# Patient Record
Sex: Female | Born: 1940 | Race: White | Hispanic: No | Smoking: Never smoker
Health system: Southern US, Community
[De-identification: ages and names within clinical notes are randomized; demographics above are authoritative.]

## PROBLEM LIST (undated history)

## (undated) DIAGNOSIS — C9110 Chronic lymphocytic leukemia of B-cell type not having achieved remission: Secondary | ICD-10-CM

## (undated) HISTORY — PX: APPENDECTOMY: SHX54

## (undated) HISTORY — PX: CHOLECYSTECTOMY: SHX55

## (undated) HISTORY — PX: ABDOMINAL HYSTERECTOMY: SHX81

## (undated) HISTORY — PX: JOINT REPLACEMENT: SHX530

---

## 2007-05-20 ENCOUNTER — Encounter (INDEPENDENT_AMBULATORY_CARE_PROVIDER_SITE_OTHER): Payer: Self-pay | Admitting: Orthopaedic Surgery

## 2007-05-20 ENCOUNTER — Ambulatory Visit (HOSPITAL_COMMUNITY): Admission: RE | Admit: 2007-05-20 | Discharge: 2007-05-20 | Payer: Self-pay | Admitting: Orthopaedic Surgery

## 2007-07-09 ENCOUNTER — Inpatient Hospital Stay (HOSPITAL_COMMUNITY): Admission: RE | Admit: 2007-07-09 | Discharge: 2007-07-15 | Payer: Self-pay | Admitting: Orthopaedic Surgery

## 2010-12-27 NOTE — Discharge Summary (Signed)
NAMEKIMERLY, Marie [name]                ACCOUNT NO.:  [id number]   MEDICAL RECORD NO.:  [id number]          PATIENT TYPE:  INP   LOCATION:  5039                         FACILITY:  [facility]   PHYSICIAN:  Lindwood Qua, P.A. DATE OF BIRTH:  02-Mar-1941   DATE OF ADMISSION:  07/09/2007  DATE OF DISCHARGE:  07/15/2007                               DISCHARGE SUMMARY   ADMISSION DIAGNOSES:  1. Left knee end-stage degenerative joint disease.  2. Chronic lymphocytic leukemia.  3. Hormone replacement therapy   DISCHARGE DIAGNOSES:  1. Left knee end-stage degenerative joint disease.  2. Chronic lymphocytic leukemia.  3. Hormone replacement therapy   PROCEDURE:  Left total knee revision replacement.   BRIEF HISTORY:  Ms. Mclaurin is a  70 year old white female patient  complaining of left knee pain after a union condylar knee replacement in  2004.  X-rays reveals his components appear to be loose.  She is having  pain and swelling when she walks  and trouble sleeping at night time.   PERTINENT LABORATORY DATA AND X-RAY FINDINGS:  Sodium 137, potassium  4.0, chloride 102, glucose 72, BUN 16, creatinine 0.81.  Urinalysis  normal.  Hemoglobin 12.9, hematocrit 38.7,  platelet 225.  WBC's at  60,000.   HOSPITAL COURSE:  She was admitted postoperatively.  She was placed on  variety of oral and IM analgesics for pain and  IV antibiotics times 24  hours  was used.  CPM machine 0 to 50 and advance as tolerated.  She had  a PCA pump also to be used, muscle relaxers,  oral anti-pain  medications.  Coumadin and Lovenox for DVT prophylaxis.  Therapy  regulated by pharmacy, knee-high TED,  incentive spirometer also used,  Foley catheter used for the first 1-2 days and then discontinued.  Her  first day postoperative vital signs were stable.  She had normal  neurovascular status to her left lower extremity.  Drain was in place.  IV's and PCA pump being used.  They were later discontinued the second  day.   Catheter discontinued the second day.  She was in a situation with  home care.  She had no significant help at home so nursing home  placement needed to be found.  Once that was done, she was able to be  discharged on dressing changes.  Her  wounds were noted to be benign.  There were no signs of infection.  Lungs were clear.  Abdomen was soft.  She did have a slight temperature elevation and was told to use her  incentive spirometer which did seem to make a difference.  FL-2 form was  signed.  Dressings were changed prior leaving the hospital. The  wound  was noted to be normal.  She did have some temperature elevation.  The  PA for Dr. Renae Fickle, who was on call,  ordered an albuterol treatment which  did help her pulmonary status, but did cause headache and this was  discontinued.   CONDITION ON DISCHARGE:  Improved.   FOLLOW UP:  She should be on a low-sodium,  heart-healthy diet,  may be  weightbearing as tolerated.  Physical therapy should work with her leg  to gain as much motion as possible.  Change the dressing on her wound  daily.  She will have INR's drawn for two weeks.  She will be on  Coumadin for a total of two weeks.  Dose regulated by pharmacy.  Percocet one or two every 4-6 hours.  Tylenol one or two for temperature  elevation above 100.5, ferrous sulfate 325 mg one per day,  hormone-  replacement therapy 0.45 mg,  daily potassium chloride 10 mEq one a day,  vitamin B12 - 250 mcg one a day, Mega 3 acid one GM cap daily,  vitamin  C 500 mg a day and multiple vitamin one per day,  laxative enema as  needed, Percocet one or two every four to six as needed for pain,  Robaxin 500 mg one orally every six to eight as needed for  muscle  spasm.      Lindwood Qua, P.A.    ______________________________  Lindwood Qua, P.A.    MC/MEDQ  D:  07/15/2007  T:  07/15/2007  Job:  [id number]

## 2010-12-27 NOTE — Discharge Summary (Signed)
Marie Marie [name], Marie [name]                ACCOUNT NO.:  [id number]   MEDICAL RECORD NO.:  [id number]          PATIENT TYPE:  INP   LOCATION:  NA                           FACILITY:  [facility]   PHYSICIAN:  Marie Marie [name], M.D.    DATE OF BIRTH:  11-23-1940   DATE OF ADMISSION:  05/14/2007  DATE OF DISCHARGE:  05/20/2007                               DISCHARGE SUMMARY   DICTATED BY:  Marie Marie [name].   PRIMARY CARE PHYSICIAN:  Marie Marie [name] at the [facility].  Phone number there is [phone].   DISCHARGE DIAGNOSES:  1. Acute pancreatitis.  2. Hypertension.  3. Hypercholesterolemia.   DISCHARGE MEDICATIONS:  All new medications:  1. Vicodin 500/5, take 1 to 2 tablets q.4 hours.  2. Lipitor 40 mg daily.  3. Aspirin 81 mg daily.  4. Lisinopril/hydrochlorothiazide combination 10/12.5 mg daily.   CONSULTATIONS:  GI was consulted for possible choledocho stone causing  acute pancreatitis.  MR/CT was done.  MR/CT did not show any evidence of  any stone impaction.   PROCEDURES:  1. CT of abdomen with contrast was performed on May 14, 2007.      Impression:  Inflammatory stranding and retroperitoneal fluid      associated with edematous pancreatic head, consistent with acute      pancreatitis.  A small 4-mm calcific density in the region of the      ampulla may represent an obstructing choledocho stone.  No evidence      of organized fluid collection.  2. Ultrasound of abdomen performed on May 15, 2007.  Impression:      No evidence of gallstones or cholecystitis.  Heterogeneous      pancreas, likely related to pancreatitis.  3. MR/CT performed on May 17, 2007.  Impression:  No significant      dilatation of the ball ducts or the pancreatic duct.  There is a      tiny calcification seen on the CT and the head of the pancreas, and      it is not appreciable on this study.  There is evidence of      peripancreatic fluid consistent with pancreatitis.  No  discrete      pseudocyst or mass.  4. KUB of abdomen, rule out ileus, performed on May 18, 2007.      Impression:  There was mild increased and probable bibasilar      atelectasis, and a small left pleural effusion, stable      nonobstructive bowel gas pattern.  5. EKG, which showed atrial enlargement and nonspecific T-wave      changes.   ADMISSION LABORATORY DATA:  Sodium 142, potassium 4.2, chloride 105,  bicarb 27, BUN 9, creatinine 0.88, glucose 139.  Amylase was 1766,  lipase was 1979, alk phos was 55, T-bili was 0.6, calcium 9.1, total  protein 6.7, albumin was 4.1, AST 24, ALT 20.   DISCHARGE LABORATORY DATA:  Sodium 133, potassium 3.6, chloride 96,  bicarb 26, BUN 8, creatinine 0.78, glucose 93.  Amylase was 40, lipase  12, AST 17,  ALT 17, alk phos was 101.  T-bili was 1.2, which is a  decrease of a max of T-bili during hospital course of 1.6.  Albumin was  2.7.  TSH was within normal limits.   BRIEF HOSPITAL COURSE:  1. Acute pancreatitis:  Laboratory, radiologic and clinical findings      suggest acute pancreatitis.  The patient has a history of chronic      alcohol abuse, which she stated that she stopped 7 months ago with      rehabilitation.  Acute pancreatitis may actually be an acute on      chronic pancreatitis versus a stone impaction.  Radiologic evidence      show a choledocho stone, which may have passed during hospital      course.  Pain was controlled with Dilaudid PCA, and then switched      to Vicodin p.o. per the patient's tolerance.  Also, during the      course of the stay, the patient had an elevated temperature to      100.6.  Zosyn 4.5 mg was started empirically.  The patient received      a couple of doses, and it was later discontinued because there were      no other clinical findings suggesting SIRS.  The patient was      tolerated some solids as well as liquids without any problems at      the date of discharge.  We will continue to follow with  p.o. pain      medication, and will follow up with her PCP within the next 3 to 4      days.  2. Hypertension:  The patient has a history of hypertension during      hospital course.  Elevated blood pressures were mostly contributed      to the patient's pain control, as elevations with increased pain      during duration blood pressure were ranged from the 140s to 190s      for systolic, and 70s to upper 90s for diastolic.  Because the      patient was n.p.o., no antihypertensives were started at the      beginning of the hospital course.  The day prior to discharge, the      patient was able to tolerate some foods; therefore, lisinopril 10      mg daily and hydrochlorothiazide 12.5 mg daily were started to      control the patient's blood pressure, and we will continue with a      combination of the two in outpatient.  3. Hypercholesterolemia:  During the hospital course, a lipid panel      was performed.  Cholesterol came back at 270, HDL 37, LDL 220,      triglycerides of 63.  The patient was not started on any type of      statin during the hospital course due to n.p.o. status.  At the      date of discharge, the patient was started on Lipitor 40 mg daily,      which she will continue as an outpatient.  4. Abnormal EKG:  The patient had two abnormal EKGs, both with the      same impression of possible atrial enlargement and nonspecific T-      wave changes.  Cardiac enzymes were only slightly elevated.  There      were no clinical findings on exam, so the patient was asymptomatic  during the hospital course.  There was no action taken at this      time.  The plan was to start aspirin 81 mg, antihypertensive and      statin as soon as the patient was able to tolerate food.  On      discharge, the patient was started on aspirin 81 mg, as well as      lisinopril/hydrochlorothiazide, and Lipitor 40.  The patient will      continue to follow up in outpatient clinic.   DISCHARGE  INSTRUCTIONS:  The patient was advised to start a low-salt-  healthy diet, also with low-fat content.  The patient was also told to  follow up with her PCP, which would be Marie Marie [name], on October 10, at 1:30  p.m.  The phone number there at the [facility] is  [phone].      Marie Marie [name], M.D.  Electronically Signed     WAH/MEDQ  D:  05/20/2007  T:  05/20/2007  Job:  [id number]

## 2010-12-27 NOTE — Op Note (Signed)
Marie [name], Marie [name]                ACCOUNT NO.:  [id number]   MEDICAL RECORD NO.:  [id number]          PATIENT TYPE:  INP   LOCATION:  2550                         FACILITY:  [facility]   PHYSICIAN:  Lubertha Basque. Dalldorf, M.D.DATE OF BIRTH:  1940-12-19   DATE OF PROCEDURE:  07/09/2007  DATE OF DISCHARGE:                               OPERATIVE REPORT   PREOPERATIVE DIAGNOSIS:  Loose left unicondylar knee replacement.   POSTOPERATIVE DIAGNOSIS:  Loose left unicondylar knee replacement.   PROCEDURE:  Revision of left total knee replacement.   ANESTHESIA:  General and block.   ATTENDING SURGEON:  Lubertha Basque. Jerl Santos, M.D.   ASSISTANT:  Lindwood Qua, P.A.   INDICATIONS FOR PROCEDURE:  The patient is a 70 year old woman, about 4  years from a left unicondylar knee replacement done in [place].  This  has always hurt somewhat, but has been much worse recently and she has  developed a bit of a varus deformity.  She has pain which limits her  ability to walk and rest.  Her x-ray shows she has a loose unicondylar  replacement.  She is offered a revision procedure.  Informed operative  consent was obtained, after the discussion of the possible complications  of reaction to anesthesia, infection, DVT, PE, and death.   SUMMARY FINDINGS AND PROCEDURE:  Under general anesthesia and a block  through an extension of her old incision, a loose unicondylar knee  replacement was removed.  There was no sign of infection.  She had a bit  of synovitis, addressed with a thorough synovectomy.  We then performed  a revision procedure using DePuy components.  We used a size standard  femoral LCS component, with a 17.5 mm spacer.  We also placed a size 3  MBT with a 30 mm stem extension, cemented.  At the patella we used a  size 38 all-polyethylene component.  We did include antibiotic in the  cement.  Bryna Colander assisted throughout and was invaluable to the  completion of the case, and he helped position  and retract while I  performed the procedure.  He also closed simultaneously to help minimize  the OR time.   DESCRIPTION OF PROCEDURE:  The patient was taken to the operating suite,  where general anesthetic was applied without difficulty.  She was also  given a block in the preanesthesia area.  She was positioned supine;  prepped and draped in normal sterile fashion.  After the administration  of IV Kefzol, the left leg was elevated, exsanguinated, and tourniquet  inflated about the thigh.  We used her entire old incision with a  proximal extension.  Dissection was carried down to the extensor  mechanism, where a medial parapatellar incision was made.  All  appropriate anti-infective measures were used, including the  preoperative IV antibiotic, Betadine-impregnated drape, and closed  hooded exhaust systems for each member of the surgical team.  The  kneecap was flipped and the knee flexed.  The tibial component was  frankly loose, and was easily removed with some pickups.  The femoral  component was loose, but did require  an osteotome to lever off.  There  was a bit of bone defect medially.  We placed an intramedullary guide  and made a cut basically flush with the medial defect, and removed about  11 or 12 mm of lateral tibial bone.  We then placed an intramedullary  guide in the femur, made anterior and posterior cuts creating a flexion  gap of 17.5 mm.  A second intramedullary isthmus was placed in the  femur, and a distal cut was created -- correcting her varus deformity  and achieving a balance 17.5 mm extension gap.  The femur was sized to a  standard with the appropriate guides placed and utilized, while the  tibia sized to a 3 with appropriate guides placed and utilized for the  MBT revision tray.  We removed some cement and soft tissue from both  bones with a curet.  The patella was cut down in thickness by 10 mm to  15, nd sized to a 38 with the appropriate guides placed and  utilized.  We then used pulsatile lavage to clean all 3 cut bony surfaces; after a  trial reduction showed good alignment and good tracking in the patella.  Cement was mixed, including Zinacef 1.5 grams.  This was pressurized  onto all the bones, followed by placement of the aforementioned DePuy  components.  Pressure was held on the components until the cement had  hardened.  Excess cement was trimmed.  The knee again ranged fully with  some flat hyperextension to good flexion; the patella tracked well.  The  tourniquet was deflated and a small amount of bleeding was easily  controlled with Bovie cautery.  The knee was irrigated, followed by  placement of a drain after exiting superolaterally.  The extensor  mechanism was reapproximated with #1 Vicryl in interrupted fashion,  followed by subcutaneous reapproximation with 0 and 2-0 undyed Vicryl;  and skin closure with staples.  Adaptic was applied, followed by dry  gauze and a loose Ace wrap.  Estimated blood loss and intraoperative  fluids obtained from anesthesia records, as to the accurate tourniquet  time.   DISPOSITION:  The patient was extubated in the operating room and taken  to recovery in stable condition.  She was to be admitted to the  orthopedic surgery service for appropriate postoperative care; to  include perioperative antibiotics and Coumadin plus Lovenox for DVT  prophylaxis.      Lubertha Basque Jerl Santos, M.D.  Electronically Signed     PGD/MEDQ  D:  07/09/2007  T:  07/09/2007  Job:  [id number]

## 2011-05-22 LAB — PROTIME-INR: Prothrombin Time: 28.2 — ABNORMAL HIGH

## 2011-05-23 LAB — CBC
HCT: 24.6 — ABNORMAL LOW
HCT: 25 — ABNORMAL LOW
HCT: 38.7
Hemoglobin: 8.2 — ABNORMAL LOW
Hemoglobin: 8.6 — ABNORMAL LOW
Hemoglobin: 9.7 — ABNORMAL LOW
MCHC: 33.3
MCHC: 33.6
MCHC: 34.3
MCV: 97.2
MCV: 97.6
Platelets: 225
RBC: 2.53 — ABNORMAL LOW
RBC: 2.62 — ABNORMAL LOW
RBC: 2.98 — ABNORMAL LOW
RDW: 13.4
RDW: 13.5
WBC: 36.9 — ABNORMAL HIGH

## 2011-05-23 LAB — BASIC METABOLIC PANEL
BUN: 16
BUN: 6
CO2: 28
CO2: 31
Calcium: 8.3 — ABNORMAL LOW
Chloride: 102
Chloride: 98
Chloride: 98
Chloride: 98
GFR calc Af Amer: >60
GFR calc Af Amer: >60
Glucose, Bld: 116 — ABNORMAL HIGH
Glucose, Bld: 72
Potassium: 4
Potassium: 4
Potassium: 4.1
Sodium: 133 — ABNORMAL LOW
Sodium: 135
Sodium: 137

## 2011-05-23 LAB — URINALYSIS, ROUTINE W REFLEX MICROSCOPIC
Bilirubin Urine: NEGATIVE
Ketones, ur: NEGATIVE
Nitrite: NEGATIVE
Protein, ur: NEGATIVE
Urobilinogen, UA: 0.2

## 2011-05-23 LAB — PROTIME-INR
INR: 1.1
INR: 3.8 — ABNORMAL HIGH
Prothrombin Time: 38.6 — ABNORMAL HIGH

## 2011-05-25 LAB — DIFFERENTIAL
Basophils Absolute: 0
Eosinophils Relative: 0
Lymphocytes Relative: 86 — ABNORMAL HIGH
Lymphs Abs: 44.6 — ABNORMAL HIGH
Monocytes Absolute: 1.3 — ABNORMAL HIGH
Neutro Abs: 5.6

## 2011-05-25 LAB — CBC
HCT: 38
Hemoglobin: 12.7
Platelets: 219
WBC: 51.3

## 2016-01-25 DIAGNOSIS — C9111 Chronic lymphocytic leukemia of B-cell type in remission: Secondary | ICD-10-CM | POA: Diagnosis not present

## 2016-05-19 ENCOUNTER — Emergency Department (HOSPITAL_COMMUNITY): Payer: Medicare Other

## 2016-05-19 ENCOUNTER — Inpatient Hospital Stay (HOSPITAL_COMMUNITY): Payer: Medicare Other

## 2016-05-19 ENCOUNTER — Inpatient Hospital Stay (HOSPITAL_COMMUNITY)
Admission: EM | Admit: 2016-05-19 | Discharge: 2016-06-14 | DRG: 463 | Disposition: E | Payer: Medicare Other | Attending: General Surgery | Admitting: General Surgery

## 2016-05-19 ENCOUNTER — Encounter (HOSPITAL_COMMUNITY): Payer: Self-pay

## 2016-05-19 DIAGNOSIS — Z23 Encounter for immunization: Secondary | ICD-10-CM

## 2016-05-19 DIAGNOSIS — M79676 Pain in unspecified toe(s): Secondary | ICD-10-CM

## 2016-05-19 DIAGNOSIS — N390 Urinary tract infection, site not specified: Secondary | ICD-10-CM | POA: Diagnosis not present

## 2016-05-19 DIAGNOSIS — J189 Pneumonia, unspecified organism: Secondary | ICD-10-CM | POA: Diagnosis not present

## 2016-05-19 DIAGNOSIS — E860 Dehydration: Secondary | ICD-10-CM | POA: Diagnosis present

## 2016-05-19 DIAGNOSIS — S52514A Nondisplaced fracture of right radial styloid process, initial encounter for closed fracture: Secondary | ICD-10-CM | POA: Diagnosis present

## 2016-05-19 DIAGNOSIS — I4891 Unspecified atrial fibrillation: Secondary | ICD-10-CM | POA: Diagnosis not present

## 2016-05-19 DIAGNOSIS — S52021A Displaced fracture of olecranon process without intraarticular extension of right ulna, initial encounter for closed fracture: Secondary | ICD-10-CM | POA: Diagnosis present

## 2016-05-19 DIAGNOSIS — I2699 Other pulmonary embolism without acute cor pulmonale: Secondary | ICD-10-CM | POA: Diagnosis not present

## 2016-05-19 DIAGNOSIS — N63 Unspecified lump in unspecified breast: Secondary | ICD-10-CM | POA: Diagnosis present

## 2016-05-19 DIAGNOSIS — S32402A Unspecified fracture of left acetabulum, initial encounter for closed fracture: Principal | ICD-10-CM | POA: Diagnosis present

## 2016-05-19 DIAGNOSIS — R262 Difficulty in walking, not elsewhere classified: Secondary | ICD-10-CM

## 2016-05-19 DIAGNOSIS — E876 Hypokalemia: Secondary | ICD-10-CM | POA: Diagnosis present

## 2016-05-19 DIAGNOSIS — R000 Tachycardia, unspecified: Secondary | ICD-10-CM

## 2016-05-19 DIAGNOSIS — M542 Cervicalgia: Secondary | ICD-10-CM | POA: Diagnosis not present

## 2016-05-19 DIAGNOSIS — S2239XA Fracture of one rib, unspecified side, initial encounter for closed fracture: Secondary | ICD-10-CM

## 2016-05-19 DIAGNOSIS — S3282XA Multiple fractures of pelvis without disruption of pelvic ring, initial encounter for closed fracture: Secondary | ICD-10-CM | POA: Diagnosis present

## 2016-05-19 DIAGNOSIS — A6000 Herpesviral infection of urogenital system, unspecified: Secondary | ICD-10-CM | POA: Diagnosis not present

## 2016-05-19 DIAGNOSIS — Y92410 Unspecified street and highway as the place of occurrence of the external cause: Secondary | ICD-10-CM

## 2016-05-19 DIAGNOSIS — T148XXA Other injury of unspecified body region, initial encounter: Secondary | ICD-10-CM

## 2016-05-19 DIAGNOSIS — M545 Low back pain, unspecified: Secondary | ICD-10-CM

## 2016-05-19 DIAGNOSIS — K598 Other specified functional intestinal disorders: Secondary | ICD-10-CM

## 2016-05-19 DIAGNOSIS — D62 Acute posthemorrhagic anemia: Secondary | ICD-10-CM | POA: Diagnosis not present

## 2016-05-19 DIAGNOSIS — S32039A Unspecified fracture of third lumbar vertebra, initial encounter for closed fracture: Secondary | ICD-10-CM | POA: Diagnosis present

## 2016-05-19 DIAGNOSIS — B9620 Unspecified Escherichia coli [E. coli] as the cause of diseases classified elsewhere: Secondary | ICD-10-CM | POA: Diagnosis present

## 2016-05-19 DIAGNOSIS — R531 Weakness: Secondary | ICD-10-CM

## 2016-05-19 DIAGNOSIS — I471 Supraventricular tachycardia: Secondary | ICD-10-CM | POA: Diagnosis present

## 2016-05-19 DIAGNOSIS — C9110 Chronic lymphocytic leukemia of B-cell type not having achieved remission: Secondary | ICD-10-CM | POA: Diagnosis present

## 2016-05-19 DIAGNOSIS — S62109A Fracture of unspecified carpal bone, unspecified wrist, initial encounter for closed fracture: Secondary | ICD-10-CM | POA: Diagnosis present

## 2016-05-19 DIAGNOSIS — R5383 Other fatigue: Secondary | ICD-10-CM | POA: Diagnosis not present

## 2016-05-19 DIAGNOSIS — K560 Paralytic ileus: Secondary | ICD-10-CM

## 2016-05-19 DIAGNOSIS — R7881 Bacteremia: Secondary | ICD-10-CM | POA: Diagnosis not present

## 2016-05-19 DIAGNOSIS — S22019A Unspecified fracture of first thoracic vertebra, initial encounter for closed fracture: Secondary | ICD-10-CM | POA: Diagnosis present

## 2016-05-19 DIAGNOSIS — B009 Herpesviral infection, unspecified: Secondary | ICD-10-CM | POA: Diagnosis present

## 2016-05-19 DIAGNOSIS — S32049A Unspecified fracture of fourth lumbar vertebra, initial encounter for closed fracture: Secondary | ICD-10-CM | POA: Diagnosis present

## 2016-05-19 DIAGNOSIS — S51812A Laceration without foreign body of left forearm, initial encounter: Secondary | ICD-10-CM | POA: Diagnosis present

## 2016-05-19 DIAGNOSIS — S299XXA Unspecified injury of thorax, initial encounter: Secondary | ICD-10-CM | POA: Diagnosis present

## 2016-05-19 DIAGNOSIS — N179 Acute kidney failure, unspecified: Secondary | ICD-10-CM | POA: Diagnosis present

## 2016-05-19 DIAGNOSIS — G9340 Encephalopathy, unspecified: Secondary | ICD-10-CM | POA: Diagnosis present

## 2016-05-19 DIAGNOSIS — M25552 Pain in left hip: Secondary | ICD-10-CM

## 2016-05-19 DIAGNOSIS — E871 Hypo-osmolality and hyponatremia: Secondary | ICD-10-CM | POA: Diagnosis not present

## 2016-05-19 DIAGNOSIS — S129XXA Fracture of neck, unspecified, initial encounter: Secondary | ICD-10-CM | POA: Diagnosis present

## 2016-05-19 DIAGNOSIS — R339 Retention of urine, unspecified: Secondary | ICD-10-CM | POA: Diagnosis not present

## 2016-05-19 DIAGNOSIS — R1312 Dysphagia, oropharyngeal phase: Secondary | ICD-10-CM

## 2016-05-19 DIAGNOSIS — S2243XA Multiple fractures of ribs, bilateral, initial encounter for closed fracture: Secondary | ICD-10-CM | POA: Diagnosis present

## 2016-05-19 DIAGNOSIS — S329XXA Fracture of unspecified parts of lumbosacral spine and pelvis, initial encounter for closed fracture: Secondary | ICD-10-CM

## 2016-05-19 DIAGNOSIS — I10 Essential (primary) hypertension: Secondary | ICD-10-CM | POA: Diagnosis present

## 2016-05-19 DIAGNOSIS — K567 Ileus, unspecified: Secondary | ICD-10-CM | POA: Diagnosis not present

## 2016-05-19 DIAGNOSIS — S12601A Unspecified nondisplaced fracture of seventh cervical vertebra, initial encounter for closed fracture: Secondary | ICD-10-CM | POA: Diagnosis present

## 2016-05-19 DIAGNOSIS — A400 Sepsis due to streptococcus, group A: Secondary | ICD-10-CM | POA: Diagnosis not present

## 2016-05-19 DIAGNOSIS — I425 Other restrictive cardiomyopathy: Secondary | ICD-10-CM | POA: Diagnosis not present

## 2016-05-19 DIAGNOSIS — R739 Hyperglycemia, unspecified: Secondary | ICD-10-CM | POA: Diagnosis present

## 2016-05-19 DIAGNOSIS — S41102A Unspecified open wound of left upper arm, initial encounter: Secondary | ICD-10-CM

## 2016-05-19 DIAGNOSIS — R1310 Dysphagia, unspecified: Secondary | ICD-10-CM | POA: Diagnosis not present

## 2016-05-19 DIAGNOSIS — Z7901 Long term (current) use of anticoagulants: Secondary | ICD-10-CM

## 2016-05-19 DIAGNOSIS — S32029A Unspecified fracture of second lumbar vertebra, initial encounter for closed fracture: Secondary | ICD-10-CM | POA: Diagnosis present

## 2016-05-19 DIAGNOSIS — K5981 Ogilvie syndrome: Secondary | ICD-10-CM

## 2016-05-19 DIAGNOSIS — Z96652 Presence of left artificial knee joint: Secondary | ICD-10-CM | POA: Diagnosis present

## 2016-05-19 DIAGNOSIS — IMO0002 Reserved for concepts with insufficient information to code with codable children: Secondary | ICD-10-CM

## 2016-05-19 DIAGNOSIS — Z4659 Encounter for fitting and adjustment of other gastrointestinal appliance and device: Secondary | ICD-10-CM

## 2016-05-19 DIAGNOSIS — S2249XA Multiple fractures of ribs, unspecified side, initial encounter for closed fracture: Secondary | ICD-10-CM

## 2016-05-19 DIAGNOSIS — E162 Hypoglycemia, unspecified: Secondary | ICD-10-CM | POA: Diagnosis not present

## 2016-05-19 DIAGNOSIS — S32009A Unspecified fracture of unspecified lumbar vertebra, initial encounter for closed fracture: Secondary | ICD-10-CM | POA: Diagnosis present

## 2016-05-19 DIAGNOSIS — B001 Herpesviral vesicular dermatitis: Secondary | ICD-10-CM | POA: Diagnosis not present

## 2016-05-19 DIAGNOSIS — Z86711 Personal history of pulmonary embolism: Secondary | ICD-10-CM | POA: Diagnosis not present

## 2016-05-19 HISTORY — DX: Chronic lymphocytic leukemia of B-cell type not having achieved remission: C91.10

## 2016-05-19 LAB — I-STAT CHEM 8, ED
BUN: 25 mg/dL — ABNORMAL HIGH (ref 6–20)
CHLORIDE: 101 mmol/L (ref 101–111)
CREATININE: 1.3 mg/dL — AB (ref 0.44–1.00)
Calcium, Ion: 1.17 mmol/L (ref 1.15–1.40)
GLUCOSE: 143 mg/dL — AB (ref 65–99)
HEMATOCRIT: 36 % (ref 36.0–46.0)
Hemoglobin: 12.2 g/dL (ref 12.0–15.0)
POTASSIUM: 4.2 mmol/L (ref 3.5–5.1)
Sodium: 140 mmol/L (ref 135–145)
TCO2: 28 mmol/L (ref 0–100)

## 2016-05-19 LAB — COMPREHENSIVE METABOLIC PANEL
ALT: 31 U/L (ref 14–54)
AST: 60 U/L — AB (ref 15–41)
Albumin: 3.9 g/dL (ref 3.5–5.0)
Alkaline Phosphatase: 53 U/L (ref 38–126)
Anion gap: 8 (ref 5–15)
BILIRUBIN TOTAL: 0.5 mg/dL (ref 0.3–1.2)
BUN: 21 mg/dL — AB (ref 6–20)
CO2: 27 mmol/L (ref 22–32)
CREATININE: 1.15 mg/dL — AB (ref 0.44–1.00)
Calcium: 9.3 mg/dL (ref 8.9–10.3)
Chloride: 103 mmol/L (ref 101–111)
GFR calc Af Amer: 53 mL/min — ABNORMAL LOW (ref >60)
GFR, EST NON AFRICAN AMERICAN: 46 mL/min — AB (ref >60)
Glucose, Bld: 144 mg/dL — ABNORMAL HIGH (ref 65–99)
Potassium: 4.9 mmol/L (ref 3.5–5.1)
Sodium: 138 mmol/L (ref 135–145)
TOTAL PROTEIN: 6.4 g/dL — AB (ref 6.5–8.1)

## 2016-05-19 LAB — CBC
HCT: 38 % (ref 36.0–46.0)
Hemoglobin: 11.4 g/dL — ABNORMAL LOW (ref 12.0–15.0)
MCH: 30.3 pg (ref 26.0–34.0)
MCHC: 30 g/dL (ref 30.0–36.0)
MCV: 101.1 fL — ABNORMAL HIGH (ref 78.0–100.0)
PLATELETS: 154 10*3/uL (ref 150–400)
RBC: 3.76 MIL/uL — ABNORMAL LOW (ref 3.87–5.11)
RDW: 14.7 % (ref 11.5–15.5)
WBC: 72.2 10*3/uL — AB (ref 4.0–10.5)

## 2016-05-19 LAB — URINALYSIS, ROUTINE W REFLEX MICROSCOPIC
BILIRUBIN URINE: NEGATIVE
Glucose, UA: NEGATIVE mg/dL
Ketones, ur: NEGATIVE mg/dL
NITRITE: NEGATIVE
PROTEIN: NEGATIVE mg/dL
Specific Gravity, Urine: 1.01 (ref 1.005–1.030)
pH: 6 (ref 5.0–8.0)

## 2016-05-19 LAB — URINE MICROSCOPIC-ADD ON

## 2016-05-19 LAB — ETHANOL

## 2016-05-19 LAB — CDS SEROLOGY

## 2016-05-19 LAB — PROTIME-INR
INR: 1.07
PROTHROMBIN TIME: 13.9 s (ref 11.4–15.2)

## 2016-05-19 LAB — SAMPLE TO BLOOD BANK

## 2016-05-19 LAB — I-STAT CG4 LACTIC ACID, ED: LACTIC ACID, VENOUS: 1.56 mmol/L (ref 0.5–1.9)

## 2016-05-19 MED ORDER — FENTANYL CITRATE (PF) 100 MCG/2ML IJ SOLN
50.0000 ug | Freq: Once | INTRAMUSCULAR | Status: AC
  Administered 2016-05-19: 50 ug via INTRAVENOUS
  Filled 2016-05-19: qty 2

## 2016-05-19 MED ORDER — ONDANSETRON HCL 4 MG/2ML IJ SOLN
4.0000 mg | Freq: Four times a day (QID) | INTRAMUSCULAR | Status: DC | PRN
  Administered 2016-05-21 – 2016-06-08 (×4): 4 mg via INTRAVENOUS
  Filled 2016-05-19 (×4): qty 2

## 2016-05-19 MED ORDER — HYDROMORPHONE HCL 1 MG/ML IJ SOLN: 0.5000 mg | INTRAMUSCULAR | Status: DC | PRN

## 2016-05-19 MED ORDER — OXYCODONE HCL 5 MG PO TABS
5.0000 mg | ORAL_TABLET | ORAL | Status: DC | PRN
  Administered 2016-05-19 – 2016-05-20 (×3): 10 mg via ORAL
  Administered 2016-05-20: 15 mg via ORAL
  Administered 2016-05-20 – 2016-05-21 (×2): 10 mg via ORAL
  Administered 2016-05-21 (×2): 15 mg via ORAL
  Administered 2016-05-22 – 2016-05-23 (×2): 10 mg via ORAL
  Filled 2016-05-19 (×2): qty 2
  Filled 2016-05-19 (×2): qty 3
  Filled 2016-05-19 (×3): qty 2
  Filled 2016-05-19: qty 3
  Filled 2016-05-19: qty 2
  Filled 2016-05-19: qty 3

## 2016-05-19 MED ORDER — BACITRACIN ZINC 500 UNIT/GM EX OINT
TOPICAL_OINTMENT | Freq: Two times a day (BID) | CUTANEOUS | Status: DC
  Administered 2016-05-19: 1 via TOPICAL
  Administered 2016-05-20 – 2016-06-01 (×19): via TOPICAL
  Administered 2016-06-01 – 2016-06-02 (×3): 1 via TOPICAL
  Administered 2016-06-02: 10:00:00 via TOPICAL
  Filled 2016-05-19 (×4): qty 28.35

## 2016-05-19 MED ORDER — POLYETHYLENE GLYCOL 3350 17 G PO PACK
17.0000 g | PACK | Freq: Every day | ORAL | Status: DC
  Administered 2016-05-21 – 2016-05-25 (×4): 17 g via ORAL
  Filled 2016-05-19 (×7): qty 1

## 2016-05-19 MED ORDER — POTASSIUM CHLORIDE IN NACL 20-0.45 MEQ/L-% IV SOLN
INTRAVENOUS | Status: DC
  Administered 2016-05-19: 22:00:00 via INTRAVENOUS
  Filled 2016-05-19 (×2): qty 1000

## 2016-05-19 MED ORDER — SODIUM CHLORIDE 0.9 % IV BOLUS (SEPSIS)
1000.0000 mL | Freq: Once | INTRAVENOUS | Status: AC
Start: 2016-05-19 — End: 2016-05-19
  Administered 2016-05-19: 1000 mL via INTRAVENOUS

## 2016-05-19 MED ORDER — ENOXAPARIN SODIUM 40 MG/0.4ML ~~LOC~~ SOLN
40.0000 mg | SUBCUTANEOUS | Status: DC
  Administered 2016-05-20 – 2016-05-23 (×4): 40 mg via SUBCUTANEOUS
  Filled 2016-05-19 (×4): qty 0.4

## 2016-05-19 MED ORDER — DOCUSATE SODIUM 100 MG PO CAPS
100.0000 mg | ORAL_CAPSULE | Freq: Two times a day (BID) | ORAL | Status: DC
  Administered 2016-05-19 – 2016-06-01 (×15): 100 mg via ORAL
  Filled 2016-05-19 (×24): qty 1

## 2016-05-19 MED ORDER — TETANUS-DIPHTH-ACELL PERTUSSIS 5-2.5-18.5 LF-MCG/0.5 IM SUSP
0.5000 mL | Freq: Once | INTRAMUSCULAR | Status: AC
  Administered 2016-05-19: 0.5 mL via INTRAMUSCULAR
  Filled 2016-05-19: qty 0.5

## 2016-05-19 MED ORDER — PANTOPRAZOLE SODIUM 40 MG PO TBEC
40.0000 mg | DELAYED_RELEASE_TABLET | Freq: Every day | ORAL | Status: DC
  Administered 2016-05-19 – 2016-05-23 (×4): 40 mg via ORAL
  Filled 2016-05-19 (×4): qty 1

## 2016-05-19 MED ORDER — IOPAMIDOL (ISOVUE-300) INJECTION 61%
INTRAVENOUS | Status: AC
  Administered 2016-05-19: 100 mL via INTRAVENOUS
  Filled 2016-05-19: qty 100

## 2016-05-19 MED ORDER — ONDANSETRON HCL 4 MG PO TABS
4.0000 mg | ORAL_TABLET | Freq: Four times a day (QID) | ORAL | Status: DC | PRN
  Administered 2016-05-19: 4 mg via ORAL
  Filled 2016-05-19: qty 1

## 2016-05-19 NOTE — ED Notes (Signed)
Pt returned from X-ray.  
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
## 2016-05-19 NOTE — ED Notes (Signed)
Repositioned pt. For comfort, Changed her gown and elevated her rt. Elbow and bilateral knees with pillows and blankets.   Swabbed pt.s mouth with  Mouth sponge.  NPO maintained
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
## 2016-05-19 NOTE — Progress Notes (Signed)
Orthopedic Tech Progress Note Patient Details:  Marie [name] 07/10/1941 [id number]  Ortho Devices Type of Ortho Device: Velcro wrist splint Ortho Device/Splint Location: bilateral Ortho Device/Splint Interventions: Application   Jadier Rockers 06/11/2016, 1:58 PM RNstated that ER Doctor ordered bilateral velcro wrist sp[lints

## 2016-05-19 NOTE — Progress Notes (Signed)
Orthopedic Tech Progress Note Patient Details:  Marie [name] 12/21/1940 [id number]  Patient ID: Marie [name], female   DOB: 1940/11/14, 75 y.o.   MRN: [id number]   Hildred Priest 05/15/2016, 11:54 AM Called in bio-tech brace order; spoke with Colletta [place]

## 2016-05-19 NOTE — ED Notes (Signed)
Pt. Transferred to X-ray 

## 2016-05-19 NOTE — ED Notes (Signed)
Pt. Just returned from ct scan

## 2016-05-19 NOTE — ED Notes (Signed)
Pt.s chaplain at the bedside

## 2016-05-19 NOTE — ED Notes (Signed)
Ortho has been paged for the wrist splint and ortho tech will be here shortly to apply the splint.

## 2016-05-19 NOTE — ED Notes (Signed)
Pt. Just returned from CT scan,  Daughter at the bedside

## 2016-05-20 LAB — BASIC METABOLIC PANEL
ANION GAP: 6 (ref 5–15)
BUN: 27 mg/dL — ABNORMAL HIGH (ref 6–20)
CALCIUM: 8.5 mg/dL — AB (ref 8.9–10.3)
CO2: 23 mmol/L (ref 22–32)
Chloride: 108 mmol/L (ref 101–111)
Creatinine, Ser: 1.1 mg/dL — ABNORMAL HIGH (ref 0.44–1.00)
GFR, EST AFRICAN AMERICAN: 56 mL/min — AB (ref >60)
GFR, EST NON AFRICAN AMERICAN: 48 mL/min — AB (ref >60)
GLUCOSE: 145 mg/dL — AB (ref 65–99)
Potassium: 5.2 mmol/L — ABNORMAL HIGH (ref 3.5–5.1)
SODIUM: 137 mmol/L (ref 135–145)

## 2016-05-20 LAB — CBC
HCT: 33.6 % — ABNORMAL LOW (ref 36.0–46.0)
Hemoglobin: 10.3 g/dL — ABNORMAL LOW (ref 12.0–15.0)
MCH: 31.1 pg (ref 26.0–34.0)
MCHC: 30.7 g/dL (ref 30.0–36.0)
MCV: 101.5 fL — ABNORMAL HIGH (ref 78.0–100.0)
PLATELETS: 159 10*3/uL (ref 150–400)
RBC: 3.31 MIL/uL — ABNORMAL LOW (ref 3.87–5.11)
RDW: 15.3 % (ref 11.5–15.5)
WBC: 81.5 10*3/uL — AB (ref 4.0–10.5)

## 2016-05-20 LAB — MRSA PCR SCREENING: MRSA by PCR: NEGATIVE

## 2016-05-20 MED ORDER — LEVOTHYROXINE SODIUM 88 MCG PO TABS
88.0000 ug | ORAL_TABLET | Freq: Every day | ORAL | Status: DC
  Administered 2016-05-21 – 2016-06-02 (×11): 88 ug via ORAL
  Filled 2016-05-20 (×13): qty 1

## 2016-05-20 MED ORDER — WHITE PETROLATUM GEL
Status: AC
  Administered 2016-05-20: 10:00:00
  Filled 2016-05-20: qty 1

## 2016-05-20 MED ORDER — ORAL CARE MOUTH RINSE
15.0000 mL | Freq: Two times a day (BID) | OROMUCOSAL | Status: DC
Start: 2016-05-20 — End: 2016-05-26
  Administered 2016-05-20 – 2016-05-25 (×9): 15 mL via OROMUCOSAL

## 2016-05-20 MED ORDER — DEXTROSE-NACL 5-0.45 % IV SOLN
INTRAVENOUS | Status: DC
  Administered 2016-05-20 – 2016-05-21 (×2): via INTRAVENOUS

## 2016-05-20 MED ORDER — SERTRALINE HCL 100 MG PO TABS
100.0000 mg | ORAL_TABLET | Freq: Every day | ORAL | Status: DC
  Administered 2016-05-20 – 2016-06-02 (×11): 100 mg via ORAL
  Filled 2016-05-20 (×14): qty 1

## 2016-05-20 MED ORDER — CHLORHEXIDINE GLUCONATE 0.12 % MT SOLN
15.0000 mL | Freq: Two times a day (BID) | OROMUCOSAL | Status: DC
Start: 2016-05-20 — End: 2016-05-26
  Administered 2016-05-20 – 2016-05-25 (×10): 15 mL via OROMUCOSAL
  Filled 2016-05-20 (×5): qty 15

## 2016-05-20 NOTE — Progress Notes (Signed)
She is in pain but doing well.    Exam is stable   Plan:  TDWB LLE RUE: wrist splint and bledsoe full time, OK to WB through elbow   Mobilize with PT
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
## 2016-05-20 NOTE — Progress Notes (Signed)
Charge RN contacted the [facility] regarding not having trained staff available to insert foley on patient. AC stated to insert the foley if unsuccessful finding somebody who is trained and able to come insert. RN called around, and was unsuccessful in finding somebody that could come to 3S for foley insertion. RN inserted foley per AC's direction.

## 2016-05-21 DIAGNOSIS — S52514A Nondisplaced fracture of right radial styloid process, initial encounter for closed fracture: Secondary | ICD-10-CM | POA: Diagnosis present

## 2016-05-21 DIAGNOSIS — S52021A Displaced fracture of olecranon process without intraarticular extension of right ulna, initial encounter for closed fracture: Secondary | ICD-10-CM | POA: Diagnosis present

## 2016-05-21 LAB — BASIC METABOLIC PANEL
ANION GAP: 7 (ref 5–15)
BUN: 14 mg/dL (ref 6–20)
CO2: 25 mmol/L (ref 22–32)
Calcium: 8.1 mg/dL — ABNORMAL LOW (ref 8.9–10.3)
Chloride: 100 mmol/L — ABNORMAL LOW (ref 101–111)
Creatinine, Ser: 0.76 mg/dL (ref 0.44–1.00)
GFR calc Af Amer: >60 mL/min (ref >60)
GFR calc non Af Amer: >60 mL/min (ref >60)
GLUCOSE: 163 mg/dL — AB (ref 65–99)
POTASSIUM: 5 mmol/L (ref 3.5–5.1)
Sodium: 132 mmol/L — ABNORMAL LOW (ref 135–145)
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
## 2016-05-21 NOTE — Progress Notes (Signed)
Orthopedic Tech Progress Note Patient Details:  Marie [name] 1941-06-13 [id number]  Patient ID: Marie [name], female   DOB: 03/31/1941, 75 y.o.   MRN: [id number]   [name] for Bledsoe brace. 05/21/2016, 9:06 AM
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
## 2016-05-22 NOTE — Care Management Important Message (Signed)
Important Message  Patient Details  Name: Marie [name] MRN: [id number] Date of Birth: [birth year]   Medicare Important Message Given:  Yes    Jimya Ciani 05/22/2016, 12:08 PM

## 2016-05-22 NOTE — Progress Notes (Signed)
Responded to consult with request for prayer. Patient was unavailable both times chaplain stopped by. Chaplain available for follow-up tomorrow.   05/22/16 1700  Clinical Encounter Type  Visited With Patient  Visit Type Initial;Follow-up  Referral From Nurse  Spiritual Encounters  Spiritual Needs Prayer;Emotional  Stress Factors  Patient Stress Factors None identified
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
## 2016-05-23 ENCOUNTER — Inpatient Hospital Stay (HOSPITAL_COMMUNITY): Payer: Medicare Other

## 2016-05-23 DIAGNOSIS — S2243XA Multiple fractures of ribs, bilateral, initial encounter for closed fracture: Secondary | ICD-10-CM | POA: Diagnosis present

## 2016-05-23 DIAGNOSIS — IMO0002 Reserved for concepts with insufficient information to code with codable children: Secondary | ICD-10-CM

## 2016-05-23 DIAGNOSIS — S129XXA Fracture of neck, unspecified, initial encounter: Secondary | ICD-10-CM | POA: Diagnosis present

## 2016-05-23 DIAGNOSIS — D62 Acute posthemorrhagic anemia: Secondary | ICD-10-CM | POA: Diagnosis not present

## 2016-05-23 DIAGNOSIS — S32049A Unspecified fracture of fourth lumbar vertebra, initial encounter for closed fracture: Secondary | ICD-10-CM | POA: Diagnosis present

## 2016-05-23 DIAGNOSIS — S32009A Unspecified fracture of unspecified lumbar vertebra, initial encounter for closed fracture: Secondary | ICD-10-CM | POA: Diagnosis present

## 2016-05-23 DIAGNOSIS — S22019A Unspecified fracture of first thoracic vertebra, initial encounter for closed fracture: Secondary | ICD-10-CM | POA: Diagnosis present

## 2016-05-23 DIAGNOSIS — S3282XA Multiple fractures of pelvis without disruption of pelvic ring, initial encounter for closed fracture: Secondary | ICD-10-CM | POA: Diagnosis present

## 2016-05-23 LAB — CBC
HEMATOCRIT: 30.3 % — AB (ref 36.0–46.0)
HEMOGLOBIN: 9.4 g/dL — AB (ref 12.0–15.0)
MCH: 31 pg (ref 26.0–34.0)
MCHC: 31 g/dL (ref 30.0–36.0)
MCV: 100 fL (ref 78.0–100.0)
Platelets: 205 10*3/uL (ref 150–400)
RBC: 3.03 MIL/uL — ABNORMAL LOW (ref 3.87–5.11)
RDW: 15.5 % (ref 11.5–15.5)
WBC: 111.3 10*3/uL — AB (ref 4.0–10.5)

## 2016-05-23 LAB — URINALYSIS, ROUTINE W REFLEX MICROSCOPIC
Bilirubin Urine: NEGATIVE
GLUCOSE, UA: 250 mg/dL — AB
KETONES UR: NEGATIVE mg/dL
NITRITE: POSITIVE — AB
PH: 6 (ref 5.0–8.0)
Protein, ur: 100 mg/dL — AB
SPECIFIC GRAVITY, URINE: 1.039 — AB (ref 1.005–1.030)

## 2016-05-23 LAB — BLOOD GAS, ARTERIAL
ACID-BASE EXCESS: 0.7 mmol/L (ref 0.0–2.0)
Bicarbonate: 24.5 mmol/L (ref 20.0–28.0)
Drawn by: 312971
FIO2: 1
O2 CONTENT: 15 L/min
O2 SAT: 98.7 %
PATIENT TEMPERATURE: 98.6
pCO2 arterial: 37.2 mmHg (ref 32.0–48.0)
pH, Arterial: 7.434 (ref 7.350–7.450)
pO2, Arterial: 131 mmHg — ABNORMAL HIGH (ref 83.0–108.0)

## 2016-05-23 LAB — URINE MICROSCOPIC-ADD ON

## 2016-05-23 LAB — GLUCOSE, CAPILLARY: GLUCOSE-CAPILLARY: 246 mg/dL — AB (ref 65–99)

## 2016-05-23 MED ORDER — HEPARIN BOLUS VIA INFUSION
2000.0000 [IU] | Freq: Once | INTRAVENOUS | Status: AC
  Administered 2016-05-23: 2000 [IU] via INTRAVENOUS
  Filled 2016-05-23: qty 2000

## 2016-05-23 MED ORDER — ALBUTEROL SULFATE (2.5 MG/3ML) 0.083% IN NEBU
INHALATION_SOLUTION | RESPIRATORY_TRACT | Status: AC
  Administered 2016-05-23: 2.5 mg
  Filled 2016-05-23: qty 3

## 2016-05-23 MED ORDER — ENOXAPARIN SODIUM 40 MG/0.4ML ~~LOC~~ SOLN: 40.0000 mg | SUBCUTANEOUS | Status: AC

## 2016-05-23 MED ORDER — METOPROLOL TARTRATE 5 MG/5ML IV SOLN
5.0000 mg | INTRAVENOUS | Status: DC | PRN
  Administered 2016-05-23 – 2016-06-08 (×7): 5 mg via INTRAVENOUS
  Filled 2016-05-23 (×10): qty 5

## 2016-05-23 MED ORDER — OXYCODONE-ACETAMINOPHEN 5-325 MG PO TABS: 1.0000 | ORAL_TABLET | ORAL | 0 refills | Status: AC | PRN

## 2016-05-23 MED ORDER — IOPAMIDOL (ISOVUE-370) INJECTION 76%
INTRAVENOUS | Status: AC
  Administered 2016-05-23: 100 mL
  Filled 2016-05-23: qty 100

## 2016-05-23 MED ORDER — METOPROLOL TARTRATE 5 MG/5ML IV SOLN
INTRAVENOUS | Status: AC
  Administered 2016-05-23: 5 mg
  Filled 2016-05-23: qty 5

## 2016-05-23 MED ORDER — HYDRALAZINE HCL 20 MG/ML IJ SOLN
10.0000 mg | Freq: Four times a day (QID) | INTRAMUSCULAR | Status: DC | PRN
  Administered 2016-05-23 – 2016-06-08 (×9): 10 mg via INTRAVENOUS
  Filled 2016-05-23 (×9): qty 1

## 2016-05-23 MED ORDER — SODIUM CHLORIDE 0.9 % IV SOLN
1500.0000 mg | Freq: Once | INTRAVENOUS | Status: AC
  Administered 2016-05-23: 1500 mg via INTRAVENOUS
  Filled 2016-05-23: qty 1500

## 2016-05-23 MED ORDER — TRAMADOL HCL 50 MG PO TABS: 100.0000 mg | ORAL_TABLET | Freq: Four times a day (QID) | ORAL | 0 refills | Status: AC

## 2016-05-23 MED ORDER — PIPERACILLIN-TAZOBACTAM 3.375 G IVPB 30 MIN
3.3750 g | Freq: Once | INTRAVENOUS | Status: AC
  Administered 2016-05-23: 3.375 g via INTRAVENOUS
  Filled 2016-05-23: qty 50

## 2016-05-23 MED ORDER — ACETAMINOPHEN 650 MG RE SUPP
650.0000 mg | RECTAL | Status: DC | PRN
  Administered 2016-05-23 – 2016-05-24 (×2): 650 mg via RECTAL
  Filled 2016-05-23 (×2): qty 1

## 2016-05-23 MED ORDER — TRAMADOL HCL 50 MG PO TABS
100.0000 mg | ORAL_TABLET | Freq: Four times a day (QID) | ORAL | Status: DC
  Administered 2016-05-23 – 2016-05-24 (×2): 100 mg via ORAL
  Filled 2016-05-23 (×3): qty 2

## 2016-05-23 MED ORDER — NALOXONE HCL 0.4 MG/ML IJ SOLN
INTRAMUSCULAR | Status: AC
  Administered 2016-05-23: 0.4 mg
  Filled 2016-05-23: qty 1

## 2016-05-23 MED ORDER — FENTANYL CITRATE (PF) 100 MCG/2ML IJ SOLN
25.0000 ug | INTRAMUSCULAR | Status: DC | PRN
  Administered 2016-05-23: 25 ug via INTRAVENOUS
  Administered 2016-05-24 (×3): 50 ug via INTRAVENOUS
  Administered 2016-05-24: 25 ug via INTRAVENOUS
  Administered 2016-05-24 – 2016-05-25 (×3): 50 ug via INTRAVENOUS
  Filled 2016-05-23 (×8): qty 2

## 2016-05-23 MED ORDER — VANCOMYCIN HCL IN DEXTROSE 1-5 GM/200ML-% IV SOLN
1000.0000 mg | Freq: Two times a day (BID) | INTRAVENOUS | Status: DC
  Administered 2016-05-24: 1000 mg via INTRAVENOUS
  Filled 2016-05-23 (×2): qty 200

## 2016-05-23 MED ORDER — PIPERACILLIN-TAZOBACTAM 3.375 G IVPB
3.3750 g | Freq: Three times a day (TID) | INTRAVENOUS | Status: DC
  Administered 2016-05-24 – 2016-05-26 (×7): 3.375 g via INTRAVENOUS
  Filled 2016-05-23 (×10): qty 50

## 2016-05-23 MED ORDER — IPRATROPIUM-ALBUTEROL 0.5-2.5 (3) MG/3ML IN SOLN
3.0000 mL | Freq: Four times a day (QID) | RESPIRATORY_TRACT | Status: DC
  Administered 2016-05-24 – 2016-05-27 (×15): 3 mL via RESPIRATORY_TRACT
  Filled 2016-05-23 (×16): qty 3

## 2016-05-23 MED ORDER — HEPARIN (PORCINE) IN NACL 100-0.45 UNIT/ML-% IJ SOLN
1850.0000 [IU]/h | INTRAMUSCULAR | Status: DC
  Administered 2016-05-23: 1200 [IU]/h via INTRAVENOUS
  Administered 2016-05-24: 1050 [IU]/h via INTRAVENOUS
  Administered 2016-05-25: 1300 [IU]/h via INTRAVENOUS
  Administered 2016-05-27: 1700 [IU]/h via INTRAVENOUS
  Administered 2016-05-28 (×2): 1850 [IU]/h via INTRAVENOUS
  Filled 2016-05-23 (×18): qty 250
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
## 2016-05-23 NOTE — Clinical Social Work Placement (Signed)
   CLINICAL SOCIAL WORK PLACEMENT  NOTE  Date:  05/23/2016  Patient Details  Name: Marie [name] MRN: [id number] Date of Birth: 08/09/1941  Clinical Social Work is seeking post-discharge placement for this patient at the [place] level of care (*CSW will initial, date and re-position this form in  chart as items are completed):  Yes   Patient/family provided with [facility] Work Department's list of facilities offering this level of care within the geographic area requested by the patient (or if unable, by the patient's family).  Yes   Patient/family informed of their freedom to choose among providers that offer the needed level of care, that participate in Medicare, Medicaid or managed care program needed by the patient, have an available bed and are willing to accept the patient.  Yes   Patient/family informed of [facility]'s ownership interest in [facility] and [facility], as well as of the fact that they are under no obligation to receive care at these facilities.  PASRR submitted to EDS on       PASRR number received on       Existing PASRR number confirmed on 05/23/16     FL2 transmitted to all facilities in geographic area requested by pt/family on 05/23/16     FL2 transmitted to all facilities within larger geographic area on       Patient informed that his/her managed care company has contracts with or will negotiate with certain facilities, including the following:        Yes   Patient/family informed of bed offers received.  Patient chooses bed at       Physician recommends and patient chooses bed at      Patient to be transferred to   on  .  Patient to be transferred to facility by       Patient family notified on   of transfer.  Name of family member notified:        PHYSICIAN Please prepare priority discharge summary, including medications     Additional Comment:     _______________________________________________ Dulcy Fanny, LCSW 05/23/2016, 10:06 AM

## 2016-05-23 NOTE — Significant Event (Addendum)
Rapid Response Event Note  Overview: Time Called: 1600 Arrival Time: 1605 Event Type: Respiratory, Neurologic  Initial Focused Assessment: Was on the unit with another patient, called to assess patient for neuro changes.  Patient was in the chair, + MJ Collar, + corsett brace, + R arm brace, sitting up, and not responding to voice.  Patient did arouse to painful sternal rub, oxygen sats and vitals checked. HR in the 90s, 88% on 2L, SBP in the 160s. I increased oxygen to 4L. Instructed staff to place patient back in bed and that I would come right back to see her after moving the other patient.  On arrival the second time, patient's oxygen was off, once VS were obtained, sats were in the mid 60s, patient immediately placed on 100% NRB at 15L, trauma paged, patient was given 0.4Narcan which did not improve her mental status greatly.  ABG and CXR obtained, TRAUMA at bedside, will move to ICU.  CT ANGIO chest ordered for PE rule out.  SBP in the high 190s and HR in the 130s, lopressor ordered. Earlier today, patient had mild temp earlier, NTS by trauma MD.  Interventions: -- taken to CT --Lopressor 5 mg IV given for SBP in the 190s and HR in the 130s --family updated by TRAUMA MD  Plan of Care (if not transferred): 2M13 after CT PE done  Event Summary: Name of Physician Notified: Dr. Grandville Silos   Event End Time: 1852  Alleah Dearman, Delice Lesch
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
## 2016-05-23 NOTE — Progress Notes (Signed)
Patient ID: Marie [name], female   DOB: 1941/04/08, 75 y.o.   MRN: [id number] Patient had decreased LOC and transfer to SNF was cancelled. CXR - possible RLL PNA. Tachycardic and hypertensive - lopressor. Hypoxia improved on NRB. Bronchodilators, CT angio chest to R/O PE. Transfer to ICU. ABG P. I called her daughter, [name], and updated her. She reports patient is OK with intubation if needed.  [name], MD, MPH, FACS Trauma: [phone] General Surgery: [phone]
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
## 2016-05-23 NOTE — Progress Notes (Signed)
     Assessment: Active Problems:   Left acetabular fracture (HCC)   Nondisplaced fracture of right radial styloid process, initial encounter for closed fracture   Fracture of right olecranon process, closed, initial encounter  Sore, all over.   Plan: Mobilize w/ PT Continue Non-operative management of Pelvis / Right Arm  Weight Bearing:  Superior and inferior pubic rami fractures, Superior acetabular fracture - TDWB only  Right radial styloid fx - wrist splint full time  Right olecranon fx - Bledsoe brace in place - okay to wb through elbow   VTE prophylaxis: Lovenox Dispo: PT- Likely will need SNF / Rehab.   Follow up with Dr. Alain Marion outpatient. Please call with questions.  Subjective: Patient reports pain .  Objective:   VITALS:   Vitals:   05/21/16 2058 05/22/16 0527 05/22/16 2050 05/23/16 0500  BP: (!) 150/52 (!) 143/53 (!) 157/79 (!) 176/62  Pulse: (!) 106 99 81 87  Resp:   14 14  Temp: 100.3 F (37.9 C) (!) 100.4 F (38 C) 98.1 F (36.7 C) 97.6 F (36.4 C)  TempSrc: Oral Oral Oral Oral  SpO2: 93% 92% 95% 95%  Weight:      Height:       CBC Latest Ref Rng & Units 05/20/2016 05/26/2016 06/11/2016  WBC 4.0 - 10.5 K/uL 81.5(HH) - 72.2(HH)  Hemoglobin 12.0 - 15.0 g/dL 10.3(L) 12.2 11.4(L)  Hematocrit 36.0 - 46.0 % 33.6(L) 36.0 38.0  Platelets 150 - 400 K/uL 159 - 154   BMP Latest Ref Rng & Units 05/21/2016 05/20/2016 05/24/2016  Glucose 65 - 99 mg/dL 163(H) 145(H) 143(H)  BUN 6 - 20 mg/dL 14 27(H) 25(H)  Creatinine 0.44 - 1.00 mg/dL 0.76 1.10(H) 1.30(H)  Sodium 135 - 145 mmol/L 132(L) 137 140  Potassium 3.5 - 5.1 mmol/L 5.0 5.2(H) 4.2  Chloride 101 - 111 mmol/L 100(L) 108 101  CO2 22 - 32 mmol/L 25 23 -  Calcium 8.9 - 10.3 mg/dL 8.1(L) 8.5(L) -   Intake/Output      10/09 0701 - 10/10 0700 10/10 0701 - 10/11 0700   P.O. 120    I.V. (mL/kg)     Total Intake(mL/kg) 120 (1.5)    Urine (mL/kg/hr)     Total Output       Net +120          Urine  Occurrence 1 x     General: NAD.  Supine in bed.  c-collar in place. MSK Left LE - Foot warm, Neurovascularly intact. Sensation intact distally Right UE  Splint and bledsoe in place.  Hand warm.  Moves finger spontaneously.  Sensation intact.   Charna Elizabeth Martensen III, PA-C 05/23/2016, 8:51 AM
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
## 2016-05-23 NOTE — Clinical Social Work Placement (Signed)
   CLINICAL SOCIAL WORK PLACEMENT  NOTE  Date:  05/23/2016  Patient Details  Name: Marie [name] MRN: [id number] Date of Birth: [birth year]  Clinical Social Work is seeking post-discharge placement for this patient at the [place] level of care (*CSW will initial, date and re-position this form in  chart as items are completed):  Yes   Patient/family provided with [place] Work Department's list of facilities offering this level of care within the geographic area requested by the patient (or if unable, by the patient's family).  Yes   Patient/family informed of their freedom to choose among providers that offer the needed level of care, that participate in Medicare, Medicaid or managed care program needed by the patient, have an available bed and are willing to accept the patient.  Yes   Patient/family informed of [place]'s ownership interest in [facility] and [facility], as well as of the fact that they are under no obligation to receive care at these facilities.  PASRR submitted to EDS on       PASRR number received on       Existing PASRR number confirmed on 05/23/16     FL2 transmitted to all facilities in geographic area requested by pt/family on 05/23/16     FL2 transmitted to all facilities within larger geographic area on       Patient informed that his/her managed care company has contracts with or will negotiate with certain facilities, including the following:        Yes   Patient/family informed of bed offers received.  Patient chooses bed at [facility]     Physician recommends and patient chooses bed at      Patient to be transferred to [facility] on 05/23/16.  Patient to be transferred to facility by PTAR     Patient family notified on 05/23/16 of transfer.  Name of family member notified:  patient is alert and oriented x4. CSW inquired if patient wishes for CSW to contact daughter.  Patient  declined.     PHYSICIAN Please prepare priority discharge summary, including medications     Additional Comment:    _______________________________________________ Dulcy Fanny, LCSW 05/23/2016, 11:56 AM
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
## 2016-05-24 ENCOUNTER — Inpatient Hospital Stay (HOSPITAL_COMMUNITY): Payer: Medicare Other

## 2016-05-24 LAB — BLOOD CULTURE ID PANEL (REFLEXED)
Acinetobacter baumannii: NOT DETECTED
CANDIDA GLABRATA: NOT DETECTED
CANDIDA TROPICALIS: NOT DETECTED
Candida albicans: NOT DETECTED
Candida krusei: NOT DETECTED
Candida parapsilosis: NOT DETECTED
Enterobacter cloacae complex: NOT DETECTED
Enterobacteriaceae species: NOT DETECTED
Enterococcus species: NOT DETECTED
Escherichia coli: NOT DETECTED
HAEMOPHILUS INFLUENZAE: NOT DETECTED
KLEBSIELLA PNEUMONIAE: NOT DETECTED
Klebsiella oxytoca: NOT DETECTED
Listeria monocytogenes: NOT DETECTED
NEISSERIA MENINGITIDIS: NOT DETECTED
PROTEUS SPECIES: NOT DETECTED
PSEUDOMONAS AERUGINOSA: NOT DETECTED
SERRATIA MARCESCENS: NOT DETECTED
STAPHYLOCOCCUS AUREUS BCID: NOT DETECTED
STAPHYLOCOCCUS SPECIES: NOT DETECTED
STREPTOCOCCUS AGALACTIAE: NOT DETECTED
STREPTOCOCCUS PNEUMONIAE: NOT DETECTED
Streptococcus pyogenes: DETECTED — AB
Streptococcus species: DETECTED — AB

## 2016-05-24 LAB — GLUCOSE, CAPILLARY
GLUCOSE-CAPILLARY: 218 mg/dL — AB (ref 65–99)
Glucose-Capillary: 191 mg/dL — ABNORMAL HIGH (ref 65–99)

## 2016-05-24 LAB — CBC
HEMATOCRIT: 28.9 % — AB (ref 36.0–46.0)
Hemoglobin: 9.2 g/dL — ABNORMAL LOW (ref 12.0–15.0)
MCH: 31.1 pg (ref 26.0–34.0)
MCHC: 31.8 g/dL (ref 30.0–36.0)
MCV: 97.6 fL (ref 78.0–100.0)
PLATELETS: 156 10*3/uL (ref 150–400)
RBC: 2.96 MIL/uL — ABNORMAL LOW (ref 3.87–5.11)
RDW: 15.8 % — AB (ref 11.5–15.5)
WBC: 72.2 10*3/uL (ref 4.0–10.5)

## 2016-05-24 LAB — BASIC METABOLIC PANEL
Anion gap: 8 (ref 5–15)
BUN: 34 mg/dL — AB (ref 6–20)
CHLORIDE: 106 mmol/L (ref 101–111)
CO2: 27 mmol/L (ref 22–32)
CREATININE: 1.05 mg/dL — AB (ref 0.44–1.00)
Calcium: 8.2 mg/dL — ABNORMAL LOW (ref 8.9–10.3)
GFR calc Af Amer: 59 mL/min — ABNORMAL LOW (ref >60)
GFR calc non Af Amer: 51 mL/min — ABNORMAL LOW (ref >60)
Glucose, Bld: 261 mg/dL — ABNORMAL HIGH (ref 65–99)
POTASSIUM: 4.3 mmol/L (ref 3.5–5.1)
SODIUM: 141 mmol/L (ref 135–145)

## 2016-05-24 LAB — POCT I-STAT 3, ART BLOOD GAS (G3+)
ACID-BASE EXCESS: 1 mmol/L (ref 0.0–2.0)
Bicarbonate: 25.3 mmol/L (ref 20.0–28.0)
O2 SAT: 96 %
PCO2 ART: 38.4 mmHg (ref 32.0–48.0)
PH ART: 7.433 (ref 7.350–7.450)
TCO2: 26 mmol/L (ref 0–100)
pO2, Arterial: 82 mmHg — ABNORMAL LOW (ref 83.0–108.0)

## 2016-05-24 LAB — HEPARIN LEVEL (UNFRACTIONATED)
HEPARIN UNFRACTIONATED: 0.52 [IU]/mL (ref 0.30–0.70)
Heparin Unfractionated: 0.78 [IU]/mL — ABNORMAL HIGH (ref 0.30–0.70)

## 2016-05-24 MED ORDER — INSULIN ASPART 100 UNIT/ML ~~LOC~~ SOLN
0.0000 [IU] | SUBCUTANEOUS | Status: DC
  Administered 2016-05-24: 2 [IU] via SUBCUTANEOUS
  Administered 2016-05-24: 3 [IU] via SUBCUTANEOUS
  Administered 2016-05-25: 2 [IU] via SUBCUTANEOUS
  Administered 2016-05-25: 1 [IU] via SUBCUTANEOUS
  Administered 2016-05-25 – 2016-05-26 (×5): 2 [IU] via SUBCUTANEOUS
  Administered 2016-05-26: 3 [IU] via SUBCUTANEOUS

## 2016-05-24 NOTE — Progress Notes (Signed)
Paged trauma as patient's breathing has become progressively more labored. RT and RN agree patient is belly breathing and nasal flaring. ABG obtained and results WNL. Per trauma MD to try patient on bipap.
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
## 2016-05-24 NOTE — Progress Notes (Signed)
PT Cancellation Note  Patient Details Name: Marie [name] MRN: [id number] DOB: 03/22/1941   Cancelled Treatment:    Reason Eval/Treat Not Completed: Patient's level of consciousness. Pt sleepy and on nonrebreather.    Winfrey Chillemi 05/24/2016, 12:50 PM [employer] PT [phone]
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
## 2016-05-24 NOTE — Progress Notes (Signed)
PHARMACY - PHYSICIAN COMMUNICATION CRITICAL VALUE ALERT - BLOOD CULTURE IDENTIFICATION (BCID)  Results for orders placed or performed during the hospital encounter of 05/31/2016  Blood Culture ID Panel (Reflexed) (Collected: 05/23/2016  8:24 PM)  Result Value Ref Range   Enterococcus species NOT DETECTED NOT DETECTED   Listeria monocytogenes NOT DETECTED NOT DETECTED   Staphylococcus species NOT DETECTED NOT DETECTED   Staphylococcus aureus NOT DETECTED NOT DETECTED   Streptococcus species DETECTED (A) NOT DETECTED   Streptococcus agalactiae NOT DETECTED NOT DETECTED   Streptococcus pneumoniae NOT DETECTED NOT DETECTED   Streptococcus pyogenes DETECTED (A) NOT DETECTED   Acinetobacter baumannii NOT DETECTED NOT DETECTED   Enterobacteriaceae species NOT DETECTED NOT DETECTED   Enterobacter cloacae complex NOT DETECTED NOT DETECTED   Escherichia coli NOT DETECTED NOT DETECTED   Klebsiella oxytoca NOT DETECTED NOT DETECTED   Klebsiella pneumoniae NOT DETECTED NOT DETECTED   Proteus species NOT DETECTED NOT DETECTED   Serratia marcescens NOT DETECTED NOT DETECTED   Haemophilus influenzae NOT DETECTED NOT DETECTED   Neisseria meningitidis NOT DETECTED NOT DETECTED   Pseudomonas aeruginosa NOT DETECTED NOT DETECTED   Candida albicans NOT DETECTED NOT DETECTED   Candida glabrata NOT DETECTED NOT DETECTED   Candida krusei NOT DETECTED NOT DETECTED   Candida parapsilosis NOT DETECTED NOT DETECTED   Candida tropicalis NOT DETECTED NOT DETECTED    Name of physician (or Provider) Contacted: Dr Hulen Skains  Changes to prescribed antibiotics required: DC Vanc, Continue Zosyn  Levester Fresh, PharmD, BCPS, [facility] Clinical Pharmacist Pager [phone] 05/24/2016 9:36 AM

## 2016-05-24 NOTE — Progress Notes (Signed)
I do not notice any facial drooping or asymmetry.  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  No focal neurological deficits.  No need for CT scan currently, but will keep an eye on this.  Kathryne Eriksson. Dahlia Bailiff, MD, [place] [phone] Trauma Surgeon

## 2016-05-24 NOTE — Progress Notes (Signed)
Dr Hulen Skains at bedside, okay to try pt on NRB and take off bipap as pt tolerates.

## 2016-05-24 NOTE — Progress Notes (Signed)
OT Cancellation Note  Patient Details Name: Marie [name] MRN: [id number] DOB: [birth year]   Cancelled Treatment:    Reason Eval/Treat Not Completed: Medical issues which prohibited therapy.Patient's level of consciousness. Pt sleepy and on nonrebreather  [name] [id number] 05/24/2016, 1:40 PM

## 2016-05-24 NOTE — Progress Notes (Signed)
Arterial blood gas drawn while patient was wearing 4lpm nasal cannula.

## 2016-05-24 NOTE — Progress Notes (Signed)
Patient has slight left facial droop and left pupil 5 and sluggish and right pupil 3 and reacts briskly Patient is orientedX3.Dr Hulen Skains informed

## 2016-05-24 NOTE — Progress Notes (Signed)
RN called MD after RT assessment.  Pts breathing was labored and nasal flaring was visible as well as belly movement.  ABG obtained results ok pt placed on Bipap per MD order to help with labored breathing will continue to monitor.
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
## 2016-05-25 LAB — GLUCOSE, CAPILLARY
GLUCOSE-CAPILLARY: 142 mg/dL — AB (ref 65–99)
GLUCOSE-CAPILLARY: 184 mg/dL — AB (ref 65–99)
Glucose-Capillary: 190 mg/dL — ABNORMAL HIGH (ref 65–99)
Glucose-Capillary: 199 mg/dL — ABNORMAL HIGH (ref 65–99)
Glucose-Capillary: 183 mg/dL — ABNORMAL HIGH (ref 65–99)
Glucose-Capillary: 192 mg/dL — ABNORMAL HIGH (ref 65–99)

## 2016-05-25 LAB — BASIC METABOLIC PANEL
Anion gap: 9 (ref 5–15)
BUN: 30 mg/dL — ABNORMAL HIGH (ref 6–20)
CALCIUM: 8.5 mg/dL — AB (ref 8.9–10.3)
CO2: 28 mmol/L (ref 22–32)
CREATININE: 0.93 mg/dL (ref 0.44–1.00)
Chloride: 107 mmol/L (ref 101–111)
GFR, EST NON AFRICAN AMERICAN: 59 mL/min — AB (ref >60)
Glucose, Bld: 182 mg/dL — ABNORMAL HIGH (ref 65–99)
Potassium: 3.6 mmol/L (ref 3.5–5.1)
SODIUM: 144 mmol/L (ref 135–145)

## 2016-05-25 LAB — HEPARIN LEVEL (UNFRACTIONATED)
HEPARIN UNFRACTIONATED: 0.35 [IU]/mL (ref 0.30–0.70)
Heparin Unfractionated: 0.19 IU/mL — ABNORMAL LOW (ref 0.30–0.70)

## 2016-05-25 LAB — CBC
HCT: 28.4 % — ABNORMAL LOW (ref 36.0–46.0)
Hemoglobin: 9 g/dL — ABNORMAL LOW (ref 12.0–15.0)
MCH: 30.8 pg (ref 26.0–34.0)
MCHC: 31.7 g/dL (ref 30.0–36.0)
MCV: 97.3 fL (ref 78.0–100.0)
PLATELETS: 135 10*3/uL — AB (ref 150–400)
RBC: 2.92 MIL/uL — ABNORMAL LOW (ref 3.87–5.11)
RDW: 16.3 % — AB (ref 11.5–15.5)
WBC: 62.1 10*3/uL — AB (ref 4.0–10.5)

## 2016-05-25 MED ORDER — ACETAMINOPHEN 160 MG/5ML PO SOLN
650.0000 mg | Freq: Four times a day (QID) | ORAL | Status: DC | PRN
Start: 2016-05-25 — End: 2016-06-09
  Filled 2016-05-25: qty 20.3

## 2016-05-25 MED ORDER — WARFARIN - PHARMACIST DOSING INPATIENT: Freq: Every day | Status: DC

## 2016-05-25 MED ORDER — WARFARIN SODIUM 5 MG PO TABS
5.0000 mg | ORAL_TABLET | Freq: Once | ORAL | Status: DC
  Filled 2016-05-25: qty 1

## 2016-05-25 MED ORDER — METOPROLOL TARTRATE 12.5 MG HALF TABLET
12.5000 mg | ORAL_TABLET | Freq: Two times a day (BID) | ORAL | Status: DC
  Administered 2016-05-25: 12.5 mg via ORAL
  Filled 2016-05-25 (×3): qty 1

## 2016-05-25 MED ORDER — FENTANYL CITRATE (PF) 100 MCG/2ML IJ SOLN
12.5000 ug | INTRAMUSCULAR | Status: DC | PRN
  Administered 2016-05-28 – 2016-05-29 (×2): 12.5 ug via INTRAVENOUS
  Filled 2016-05-25 (×2): qty 2

## 2016-05-25 MED ORDER — TRAMADOL HCL 50 MG PO TABS
50.0000 mg | ORAL_TABLET | Freq: Four times a day (QID) | ORAL | Status: DC
  Administered 2016-05-26 – 2016-05-29 (×9): 50 mg via ORAL
  Filled 2016-05-25 (×9): qty 1

## 2016-05-25 MED ORDER — HALOPERIDOL LACTATE 5 MG/ML IJ SOLN
INTRAMUSCULAR | Status: AC
  Filled 2016-05-25: qty 1

## 2016-05-25 MED ORDER — HALOPERIDOL LACTATE 5 MG/ML IJ SOLN
5.0000 mg | Freq: Once | INTRAMUSCULAR | Status: AC
  Administered 2016-05-25: 5 mg via INTRAVENOUS

## 2016-05-25 NOTE — Progress Notes (Signed)
Patient agitated, pulling at lines, taking off dressings covering wound. Patient also was yelling and screaming to 'take off C-Colar". Notified MD Elsworth Soho in [place] box to place orders for agitation.
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
## 2016-05-25 NOTE — Progress Notes (Signed)
eLink Physician-Brief Progress Note Patient Name: DESIYAH RAPOZO DOB: 1941/02/25 MRN: [id number]   Date of Service  05/25/2016  HPI/Events of Note  Agitation, RNs holding down, wants collar off Narcotics being decreased  eICU Interventions  Haldol 5 mg x 1 until trauma MD contacted     Intervention Category Major Interventions: Delirium, psychosis, severe agitation - evaluation and management  ALVA,RAKESH V. 05/25/2016, 5:08 PM
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
## 2016-05-25 NOTE — Progress Notes (Signed)
Notified MD Wyatt in regards to patient having SVT run, HR went as high as 190. EKG was ordered and obtained. Patient was asymptomatic and hemodynamically stable at time.

## 2016-05-25 NOTE — Care Management Important Message (Signed)
Important Message  Patient Details  Name: Marie [name] MRN: [id number] Date of Birth: 1941-06-19   Medicare Important Message Given:  Other (see comment)    [place], Elston Aldape Abena 05/25/2016, 9:26 AM
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
## 2016-05-25 NOTE — Care Management (Signed)
CM communicated with CSW SM concern expressed by pt ; per bedside nurse pt is concerned with the welfare of her grand daughter (grandmother has custody).
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
## 2016-05-25 NOTE — Clinical Social Work Note (Signed)
CSW was notified of a concern for pt's granddaughter, for whom she has custody of, while pt is admitted and when she goes to SNF. Per RN, pt's daughter was at hospital with granddaughter and was asking for custody.   CSW met with pt at bedside. Pt has been lethargic most of the day. It took pt sometime to wake up and engage with pt. CSW asked why she had custody of granddaughter, and pt responded that pt's daughter "was not a good mother." CSW inquired about pt's granddaughter and who was caring for her while pt was in the hospital. Pt stated that pt is staying with a friend in her home while she is admitted. Pt stated the friend's name, however CSW asked pt to repeat it, because it was her as "[place]le." Pt was not able to repeat it back to [place]. Pt stated that it was approved by DSS. Pt stated that her daughter has involvement with granddaughter. CSW shared that her that she will be following up with DSS to confirm plan.   CSW filed a CPS report with [place] as they were not able to disclose if they had a case. CSW also updated covering CSW. CSW will continue to follow.   Darden Dates, MSW, LCSW  Clinical Social Worker  [phone]
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
## 2016-05-26 ENCOUNTER — Inpatient Hospital Stay (HOSPITAL_COMMUNITY): Payer: Medicare Other

## 2016-05-26 ENCOUNTER — Other Ambulatory Visit: Payer: Self-pay

## 2016-05-26 DIAGNOSIS — I4891 Unspecified atrial fibrillation: Secondary | ICD-10-CM | POA: Diagnosis not present

## 2016-05-26 DIAGNOSIS — M545 Low back pain, unspecified: Secondary | ICD-10-CM

## 2016-05-26 DIAGNOSIS — M542 Cervicalgia: Secondary | ICD-10-CM

## 2016-05-26 DIAGNOSIS — I2699 Other pulmonary embolism without acute cor pulmonale: Secondary | ICD-10-CM | POA: Diagnosis not present

## 2016-05-26 DIAGNOSIS — R000 Tachycardia, unspecified: Secondary | ICD-10-CM

## 2016-05-26 DIAGNOSIS — D62 Acute posthemorrhagic anemia: Secondary | ICD-10-CM

## 2016-05-26 DIAGNOSIS — R5383 Other fatigue: Secondary | ICD-10-CM

## 2016-05-26 DIAGNOSIS — Z86711 Personal history of pulmonary embolism: Secondary | ICD-10-CM

## 2016-05-26 DIAGNOSIS — T148XXA Other injury of unspecified body region, initial encounter: Secondary | ICD-10-CM

## 2016-05-26 LAB — CBC WITH DIFFERENTIAL/PLATELET
BASOS ABS: 0 10*3/uL (ref 0.0–0.1)
Basophils Relative: 0 %
Eosinophils Absolute: 0 10*3/uL (ref 0.0–0.7)
Eosinophils Relative: 0 %
HEMATOCRIT: 24.1 % — AB (ref 36.0–46.0)
Hemoglobin: 7.7 g/dL — ABNORMAL LOW (ref 12.0–15.0)
LYMPHS ABS: 65.2 10*3/uL — AB (ref 0.7–4.0)
Lymphocytes Relative: 88 %
MCH: 30.4 pg (ref 26.0–34.0)
MCHC: 32 g/dL (ref 30.0–36.0)
MCV: 95.3 fL (ref 78.0–100.0)
MONOS PCT: 1 %
Monocytes Absolute: 0.7 10*3/uL (ref 0.1–1.0)
NEUTROS PCT: 11 %
Neutro Abs: 8.2 10*3/uL — ABNORMAL HIGH (ref 1.7–7.7)
PLATELETS: 147 10*3/uL — AB (ref 150–400)
RBC: 2.53 MIL/uL — AB (ref 3.87–5.11)
RDW: 16.5 % — AB (ref 11.5–15.5)
WBC: 74.1 10*3/uL — AB (ref 4.0–10.5)

## 2016-05-26 LAB — BASIC METABOLIC PANEL
ANION GAP: 7 (ref 5–15)
BUN: 40 mg/dL — AB (ref 6–20)
CHLORIDE: 107 mmol/L (ref 101–111)
CO2: 29 mmol/L (ref 22–32)
Calcium: 8.2 mg/dL — ABNORMAL LOW (ref 8.9–10.3)
Creatinine, Ser: 1.17 mg/dL — ABNORMAL HIGH (ref 0.44–1.00)
GFR calc Af Amer: 52 mL/min — ABNORMAL LOW (ref >60)
GFR, EST NON AFRICAN AMERICAN: 45 mL/min — AB (ref >60)
GLUCOSE: 227 mg/dL — AB (ref 65–99)
POTASSIUM: 3.3 mmol/L — AB (ref 3.5–5.1)
Sodium: 143 mmol/L (ref 135–145)

## 2016-05-26 LAB — CULTURE, BLOOD (ROUTINE X 2)

## 2016-05-26 LAB — POCT I-STAT 3, ART BLOOD GAS (G3+)
Acid-Base Excess: 3 mmol/L — ABNORMAL HIGH (ref 0.0–2.0)
BICARBONATE: 27.8 mmol/L (ref 20.0–28.0)
O2 Saturation: 94 %
PCO2 ART: 44.2 mmHg (ref 32.0–48.0)
PO2 ART: 69 mmHg — AB (ref 83.0–108.0)
TCO2: 29 mmol/L (ref 0–100)
pH, Arterial: 7.405 (ref 7.350–7.450)

## 2016-05-26 LAB — MAGNESIUM: Magnesium: 3.3 mg/dL — ABNORMAL HIGH (ref 1.7–2.4)

## 2016-05-26 LAB — GLUCOSE, CAPILLARY
GLUCOSE-CAPILLARY: 198 mg/dL — AB (ref 65–99)
Glucose-Capillary: 209 mg/dL — ABNORMAL HIGH (ref 65–99)
Glucose-Capillary: 336 mg/dL — ABNORMAL HIGH (ref 65–99)
Glucose-Capillary: 321 mg/dL — ABNORMAL HIGH (ref 65–99)

## 2016-05-26 LAB — TSH: TSH: 1.173 u[IU]/mL (ref 0.350–4.500)

## 2016-05-26 LAB — CBC
HEMATOCRIT: 23.7 % — AB (ref 36.0–46.0)
Hemoglobin: 7.5 g/dL — ABNORMAL LOW (ref 12.0–15.0)
MCH: 30.9 pg (ref 26.0–34.0)
MCHC: 31.6 g/dL (ref 30.0–36.0)
MCV: 97.5 fL (ref 78.0–100.0)
Platelets: 130 10*3/uL — ABNORMAL LOW (ref 150–400)
RBC: 2.43 MIL/uL — AB (ref 3.87–5.11)
RDW: 16.5 % — AB (ref 11.5–15.5)
WBC: 63.7 10*3/uL — AB (ref 4.0–10.5)

## 2016-05-26 LAB — PHOSPHORUS: Phosphorus: 2.1 mg/dL — ABNORMAL LOW (ref 2.5–4.6)

## 2016-05-26 LAB — HEMOGLOBIN A1C
Hgb A1c MFr Bld: 6.6 % — ABNORMAL HIGH (ref 4.8–5.6)
MEAN PLASMA GLUCOSE: 143 mg/dL

## 2016-05-26 LAB — URINE CULTURE

## 2016-05-26 LAB — PROTIME-INR
INR: 1.55
Prothrombin Time: 18.7 s — ABNORMAL HIGH (ref 11.4–15.2)

## 2016-05-26 LAB — HEPARIN LEVEL (UNFRACTIONATED)
HEPARIN UNFRACTIONATED: 0.2 [IU]/mL — AB (ref 0.30–0.70)
Heparin Unfractionated: 0.24 IU/mL — ABNORMAL LOW (ref 0.30–0.70)

## 2016-05-26 MED ORDER — ADENOSINE 6 MG/2ML IV SOLN
6.0000 mg | Freq: Once | INTRAVENOUS | Status: AC
  Administered 2016-05-26: 6 mg via INTRAVENOUS

## 2016-05-26 MED ORDER — AMIODARONE HCL IN DEXTROSE 360-4.14 MG/200ML-% IV SOLN
30.0000 mg/h | INTRAVENOUS | Status: DC
  Administered 2016-05-26 – 2016-05-28 (×5): 30 mg/h via INTRAVENOUS
  Filled 2016-05-26 (×9): qty 200

## 2016-05-26 MED ORDER — METOPROLOL TARTRATE 5 MG/5ML IV SOLN
5.0000 mg | INTRAVENOUS | Status: AC | PRN
  Administered 2016-05-26 (×2): 5 mg via INTRAVENOUS

## 2016-05-26 MED ORDER — DILTIAZEM LOAD VIA INFUSION
15.0000 mg | Freq: Once | INTRAVENOUS | Status: AC
  Administered 2016-05-26: 15 mg via INTRAVENOUS
  Filled 2016-05-26: qty 15

## 2016-05-26 MED ORDER — ORAL CARE MOUTH RINSE: 15.0000 mL | Freq: Two times a day (BID) | OROMUCOSAL | Status: DC

## 2016-05-26 MED ORDER — AMIODARONE HCL IN DEXTROSE 360-4.14 MG/200ML-% IV SOLN
60.0000 mg/h | INTRAVENOUS | Status: AC
  Administered 2016-05-26: 60 mg/h via INTRAVENOUS
  Filled 2016-05-26 (×2): qty 200

## 2016-05-26 MED ORDER — ORAL CARE MOUTH RINSE
15.0000 mL | Freq: Two times a day (BID) | OROMUCOSAL | Status: DC
  Administered 2016-05-26 – 2016-05-27 (×2): 15 mL via OROMUCOSAL

## 2016-05-26 MED ORDER — SODIUM CHLORIDE 0.45 % IV SOLN
INTRAVENOUS | Status: DC
  Administered 2016-05-26 – 2016-05-31 (×6): via INTRAVENOUS

## 2016-05-26 MED ORDER — METOPROLOL TARTRATE 25 MG/10 ML ORAL SUSPENSION
12.5000 mg | Freq: Two times a day (BID) | ORAL | Status: DC
  Administered 2016-05-26 – 2016-05-29 (×7): 12.5 mg
  Filled 2016-05-26 (×8): qty 5

## 2016-05-26 MED ORDER — FUROSEMIDE 20 MG PO TABS
20.0000 mg | ORAL_TABLET | Freq: Every day | ORAL | Status: DC
  Filled 2016-05-26: qty 1

## 2016-05-26 MED ORDER — INSULIN ASPART 100 UNIT/ML ~~LOC~~ SOLN: 0.0000 [IU] | Freq: Three times a day (TID) | SUBCUTANEOUS | Status: DC

## 2016-05-26 MED ORDER — METOPROLOL TARTRATE 5 MG/5ML IV SOLN
INTRAVENOUS | Status: AC
  Administered 2016-05-26: 5 mg
  Filled 2016-05-26: qty 5

## 2016-05-26 MED ORDER — DILTIAZEM HCL 100 MG IV SOLR
5.0000 mg/h | INTRAVENOUS | Status: DC
  Administered 2016-05-26 (×3): 15 mg/h via INTRAVENOUS
  Administered 2016-05-27 – 2016-05-28 (×3): 10 mg/h via INTRAVENOUS
  Filled 2016-05-26 (×9): qty 100

## 2016-05-26 MED ORDER — CEFAZOLIN SODIUM-DEXTROSE 2-4 GM/100ML-% IV SOLN
2.0000 g | Freq: Three times a day (TID) | INTRAVENOUS | Status: DC
  Administered 2016-05-26 – 2016-06-05 (×29): 2 g via INTRAVENOUS
  Filled 2016-05-26 (×41): qty 100

## 2016-05-26 MED ORDER — FREE WATER
200.0000 mL | Freq: Three times a day (TID) | Status: DC
  Administered 2016-05-26 – 2016-05-31 (×15): 200 mL

## 2016-05-26 MED ORDER — ADENOSINE 6 MG/2ML IV SOLN
INTRAVENOUS | Status: AC
  Filled 2016-05-26: qty 2

## 2016-05-26 MED ORDER — METOPROLOL TARTRATE 5 MG/5ML IV SOLN: 5.0000 mg | Freq: Once | INTRAVENOUS | Status: DC

## 2016-05-26 MED ORDER — POTASSIUM CHLORIDE 10 MEQ/50ML IV SOLN
10.0000 meq | INTRAVENOUS | Status: AC
  Administered 2016-05-26 (×3): 10 meq via INTRAVENOUS
  Filled 2016-05-26: qty 50

## 2016-05-26 MED ORDER — GLUCERNA 1.2 CAL PO LIQD
1000.0000 mL | ORAL | Status: DC
  Administered 2016-05-26 – 2016-06-05 (×12): 1000 mL
  Filled 2016-05-26 (×23): qty 1000

## 2016-05-26 MED ORDER — INSULIN ASPART 100 UNIT/ML ~~LOC~~ SOLN
0.0000 [IU] | SUBCUTANEOUS | Status: DC
  Administered 2016-05-26 (×2): 11 [IU] via SUBCUTANEOUS
  Administered 2016-05-27: 8 [IU] via SUBCUTANEOUS

## 2016-05-26 MED ORDER — PRO-STAT SUGAR FREE PO LIQD: 30.0000 mL | Freq: Two times a day (BID) | ORAL | Status: DC

## 2016-05-26 MED ORDER — AMIODARONE LOAD VIA INFUSION
150.0000 mg | Freq: Once | INTRAVENOUS | Status: AC
  Administered 2016-05-26: 150 mg via INTRAVENOUS
  Filled 2016-05-26: qty 83.34

## 2016-05-26 MED ORDER — CHLORHEXIDINE GLUCONATE 0.12 % MT SOLN
15.0000 mL | Freq: Two times a day (BID) | OROMUCOSAL | Status: DC
  Administered 2016-05-26 (×2): 15 mL via OROMUCOSAL

## 2016-05-26 MED ORDER — LORATADINE 10 MG PO TABS
10.0000 mg | ORAL_TABLET | Freq: Every day | ORAL | Status: DC
  Administered 2016-05-26 – 2016-06-02 (×8): 10 mg via ORAL
  Filled 2016-05-26 (×8): qty 1

## 2016-05-26 NOTE — Progress Notes (Signed)
eLink Physician-Brief Progress Note Patient Name: Marie [name] DOB: Jun 15, 1941 MRN: [id number]   Date of Service  05/26/2016  HPI/Events of Note  HR still elevated post metoprolol   eICU Interventions  Will order diltiazem infusion order set for rate control; Have asked RN to contact primary service, they will likely need to consult cardiology     Intervention Category Major Interventions: Arrhythmia - evaluation and management  Simonne Maffucci 05/26/2016, 1:20 AM

## 2016-05-26 NOTE — Progress Notes (Signed)
SLP Cancellation Note  Patient Details Name: Marie [name] MRN: [id number] DOB: Jul 20, 1941   Cancelled treatment:       Reason Eval/Treat Not Completed: Other (comment) Pt undergoing testing in room upon SLP arrival. Discussed pt with RN - will f/u as able.   Germain Osgood 05/26/2016, 11:16 AM  Germain Osgood, M.A. CCC-SLP [phone]

## 2016-05-26 NOTE — Progress Notes (Signed)
  Amiodarone Drug - Drug Interaction Consult Note  Recommendations: Will need empiric dose reduction for warfarin  Monitor vitals, QTc closely  Amiodarone is metabolized by the cytochrome P450 system and therefore has the potential to cause many drug interactions. Amiodarone has an average plasma half-life of 50 days (range 20 to 100 days).   There is potential for drug interactions to occur several weeks or months after stopping treatment and the onset of drug interactions may be slow after initiating amiodarone.   []  Statins: Increased risk of myopathy. Simvastatin- restrict dose to 20mg  daily. Other statins: counsel patients to report any muscle pain or weakness immediately.  [x]  Anticoagulants: Amiodarone can increase anticoagulant effect. Consider warfarin dose reduction. Patients should be monitored closely and the dose of anticoagulant altered accordingly, remembering that amiodarone levels take several weeks to stabilize.  []  Antiepileptics: Amiodarone can increase plasma concentration of phenytoin, the dose should be reduced. Note that small changes in phenytoin dose can result in large changes in levels. Monitor patient and counsel on signs of toxicity.  [x]  Beta blockers: increased risk of bradycardia, AV block and myocardial depression. Sotalol - avoid concomitant use.  []   Calcium channel blockers (diltiazem and verapamil): increased risk of bradycardia, AV block and myocardial depression.  []   Cyclosporine: Amiodarone increases levels of cyclosporine. Reduced dose of cyclosporine is recommended.  []  Digoxin dose should be halved when amiodarone is started.  []  Diuretics: increased risk of cardiotoxicity if hypokalemia occurs.  []  Oral hypoglycemic agents (glyburide, glipizide, glimepiride): increased risk of hypoglycemia. Patient's glucose levels should be monitored closely when initiating amiodarone therapy.   [x]  Drugs that prolong the QT interval:  Torsades de pointes  risk may be increased with concurrent use - avoid if possible.  Monitor QTc, also keep magnesium/potassium WNL if concurrent therapy can't be avoided. Marland Kitchen Antibiotics: e.g. fluoroquinolones, erythromycin. . Antiarrhythmics: e.g. quinidine, procainamide, disopyramide, sotalol. . Antipsychotics: e.g. phenothiazines, haloperidol.  . Lithium, tricyclic antidepressants, and methadone. Thank You,  Narda Bonds  05/26/2016 2:23 AM
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
## 2016-05-26 NOTE — Progress Notes (Signed)
Patient ID: Marie [name], female   DOB: Jul 11, 1941, 75 y.o.   MRN: [id number] Appreciate Dr. Janit Pagan assistance. Remains in SVT despite amio and dilt. SBP OK. Arouses and answers questions. I will consult cardiology. Marie Skeans, MD, MPH, FACS Trauma: [phone] General Surgery: [phone]

## 2016-05-26 NOTE — Progress Notes (Signed)
eLink Physician-Brief Progress Note Patient Name: Marie [name] DOB: Sep 17, 1940 MRN: [id number]   Date of Service  05/26/2016  HPI/Events of Note  SVT, HD stable, mental status similar to earlier today Baseline EKG without clear evidence of pre-excitation  eICU Interventions  1) ICE packs for vagal stimuli 2) Adenosine: appears to have atrial fibrillation 3) Metoprolol 2.5 mg IV      Intervention Category Major Interventions: Arrhythmia - evaluation and management  Simonne Maffucci 05/26/2016, 1:07 AM

## 2016-05-26 NOTE — Procedures (Signed)
Central Venous Catheter Insertion Procedure Note Marie [name] [id number] 08-May-1941  Procedure: Insertion of Central Venous Catheter Indications: Assessment of intravascular volume, Drug and/or fluid administration, Frequent blood sampling and loss access  Procedure Details Consent: Unable to obtain consent because of emergent medical necessity. Time Out: Verified patient identification, verified procedure, site/side was marked, verified correct patient position, special equipment/implants available, medications/allergies/relevent history reviewed, required imaging and test results available.  Performed  Maximum sterile technique was used including antiseptics, cap, gloves, gown, hand hygiene, mask and sheet. Skin prep: Chlorhexidine; local anesthetic administered A antimicrobial bonded/coated triple lumen catheter was placed in the right femoral vein due to emergent situation using the Seldinger technique.  Evaluation Blood flow good Complications: No apparent complications Patient did tolerate procedure well. Chest X-ray ordered to verify placement.  CXR: normal.  [name] J. 05/26/2016, 1:48 AM  [place] No access Svt D/w trauma  Lavon Paganini. Titus Mould, MD, [place] Pgr: [facility]

## 2016-05-26 NOTE — Progress Notes (Signed)
Cortrak tube placed in patients right nare to 60cm. Pt tolerated fairly well. Pt was awake and asked the insertion to stop several time however, never reached for tube. Xray ordered and tube added to assessment. Please save tube if patient removes it. It may be reinserted.Please  call cortrak team with any questions or concerns.

## 2016-05-26 NOTE — Progress Notes (Signed)
Cortac team notified of order for tube placement
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
## 2016-05-26 NOTE — Progress Notes (Signed)
Follow-up of Dr. Demetrio Lapping cardiac consultation overnight. Hemodynamically stable on cardizem and amiodarone. Suspect a-fib driven by pain, dehydration, and PE.  Echo is pending. Continue current therapy. Cardiology will follow wit you.  Pixie Casino, MD, [facility] Attending Cardiologist [place]

## 2016-05-26 NOTE — Progress Notes (Signed)
**  Preliminary report by tech**  Bilateral lower extremity venous duplex completed. There is evidence of superficial thrombosis involving the right lower extremity in the lesser saphenous vein.  There is no evidence of deep vein thrombosis involving the right and left lower extremities. There is no evidence of superficial vein thrombosis involving the left lower extremity. There is no evidence of Baker's cysts bilaterally. Results given to the patient's nurse, Josh.  05/26/16 11:08 AM Carlos Levering RVT

## 2016-05-27 ENCOUNTER — Inpatient Hospital Stay (HOSPITAL_COMMUNITY): Payer: Medicare Other

## 2016-05-27 DIAGNOSIS — R7881 Bacteremia: Secondary | ICD-10-CM | POA: Diagnosis not present

## 2016-05-27 DIAGNOSIS — I4891 Unspecified atrial fibrillation: Secondary | ICD-10-CM

## 2016-05-27 DIAGNOSIS — I425 Other restrictive cardiomyopathy: Secondary | ICD-10-CM

## 2016-05-27 DIAGNOSIS — N390 Urinary tract infection, site not specified: Secondary | ICD-10-CM | POA: Diagnosis not present

## 2016-05-27 DIAGNOSIS — I2699 Other pulmonary embolism without acute cor pulmonale: Secondary | ICD-10-CM

## 2016-05-27 LAB — GLUCOSE, CAPILLARY
GLUCOSE-CAPILLARY: 265 mg/dL — AB (ref 65–99)
GLUCOSE-CAPILLARY: 398 mg/dL — AB (ref 65–99)
GLUCOSE-CAPILLARY: 311 mg/dL — AB (ref 65–99)
Glucose-Capillary: 201 mg/dL — ABNORMAL HIGH (ref 65–99)
Glucose-Capillary: 237 mg/dL — ABNORMAL HIGH (ref 65–99)
Glucose-Capillary: 299 mg/dL — ABNORMAL HIGH (ref 65–99)

## 2016-05-27 LAB — CBC WITH DIFFERENTIAL/PLATELET
Basophils Absolute: 0 10*3/uL (ref 0.0–0.1)
Basophils Relative: 0 %
EOS PCT: 0 %
Eosinophils Absolute: 0 10*3/uL (ref 0.0–0.7)
HEMATOCRIT: 26.1 % — AB (ref 36.0–46.0)
HEMOGLOBIN: 8.6 g/dL — AB (ref 12.0–15.0)
LYMPHS ABS: 60.4 10*3/uL — AB (ref 0.7–4.0)
Lymphocytes Relative: 88 %
MCH: 31.4 pg (ref 26.0–34.0)
MCHC: 33 g/dL (ref 30.0–36.0)
MCV: 95.3 fL (ref 78.0–100.0)
MONOS PCT: 1 %
Monocytes Absolute: 0.7 10*3/uL (ref 0.1–1.0)
NEUTROS PCT: 11 %
Neutro Abs: 7.5 10*3/uL (ref 1.7–7.7)
Platelets: 151 10*3/uL (ref 150–400)
RBC: 2.74 MIL/uL — AB (ref 3.87–5.11)
RDW: 16.7 % — ABNORMAL HIGH (ref 11.5–15.5)
WBC: 68.6 10*3/uL — AB (ref 4.0–10.5)

## 2016-05-27 LAB — RENAL FUNCTION PANEL
Albumin: 1.7 g/dL — ABNORMAL LOW (ref 3.5–5.0)
Anion gap: 9 (ref 5–15)
BUN: 55 mg/dL — ABNORMAL HIGH (ref 6–20)
CHLORIDE: 105 mmol/L (ref 101–111)
CO2: 27 mmol/L (ref 22–32)
CREATININE: 1.54 mg/dL — AB (ref 0.44–1.00)
Calcium: 7.8 mg/dL — ABNORMAL LOW (ref 8.9–10.3)
GFR calc non Af Amer: 32 mL/min — ABNORMAL LOW (ref >60)
GFR, EST AFRICAN AMERICAN: 37 mL/min — AB (ref >60)
GLUCOSE: 281 mg/dL — AB (ref 65–99)
Phosphorus: 1.5 mg/dL — ABNORMAL LOW (ref 2.5–4.6)
Potassium: 3.4 mmol/L — ABNORMAL LOW (ref 3.5–5.1)
Sodium: 141 mmol/L (ref 135–145)

## 2016-05-27 LAB — ECHOCARDIOGRAM COMPLETE
Height: 66 in
Weight: 2977.09 oz

## 2016-05-27 LAB — HEPARIN LEVEL (UNFRACTIONATED)
HEPARIN UNFRACTIONATED: 0.33 [IU]/mL (ref 0.30–0.70)
HEPARIN UNFRACTIONATED: 0.2 [IU]/mL — AB (ref 0.30–0.70)
HEPARIN UNFRACTIONATED: 0.44 [IU]/mL (ref 0.30–0.70)

## 2016-05-27 LAB — MAGNESIUM: Magnesium: 3 mg/dL — ABNORMAL HIGH (ref 1.7–2.4)

## 2016-05-27 LAB — PROTIME-INR
INR: 1.59
Prothrombin Time: 19.1 seconds — ABNORMAL HIGH (ref 11.4–15.2)

## 2016-05-27 MED ORDER — SENNOSIDES 8.8 MG/5ML PO SYRP
5.0000 mL | ORAL_SOLUTION | Freq: Two times a day (BID) | ORAL | Status: DC
  Administered 2016-05-27 – 2016-06-01 (×5): 5 mL
  Filled 2016-05-27 (×11): qty 5

## 2016-05-27 MED ORDER — RESOURCE THICKENUP CLEAR PO POWD
ORAL | Status: DC | PRN
Start: 2016-05-27 — End: 2016-06-09
  Filled 2016-05-27 (×2): qty 125

## 2016-05-27 MED ORDER — INSULIN NPH (HUMAN) (ISOPHANE) 100 UNIT/ML ~~LOC~~ SUSP
10.0000 [IU] | Freq: Two times a day (BID) | SUBCUTANEOUS | Status: DC
  Administered 2016-05-27 (×2): 10 [IU] via SUBCUTANEOUS
  Filled 2016-05-27: qty 10

## 2016-05-27 MED ORDER — ORAL CARE MOUTH RINSE
15.0000 mL | Freq: Two times a day (BID) | OROMUCOSAL | Status: DC
  Administered 2016-05-27 – 2016-06-02 (×13): 15 mL via OROMUCOSAL

## 2016-05-27 MED ORDER — INSULIN ASPART 100 UNIT/ML ~~LOC~~ SOLN
0.0000 [IU] | SUBCUTANEOUS | Status: DC
Start: 2016-05-27 — End: 2016-06-03
  Administered 2016-05-27: 15 [IU] via SUBCUTANEOUS
  Administered 2016-05-27: 7 [IU] via SUBCUTANEOUS
  Administered 2016-05-27: 11 [IU] via SUBCUTANEOUS
  Administered 2016-05-27: 20 [IU] via SUBCUTANEOUS
  Administered 2016-05-27 – 2016-05-28 (×5): 7 [IU] via SUBCUTANEOUS
  Administered 2016-05-28 – 2016-05-29 (×5): 4 [IU] via SUBCUTANEOUS
  Administered 2016-05-29: 7 [IU] via SUBCUTANEOUS
  Administered 2016-05-29 – 2016-05-30 (×2): 3 [IU] via SUBCUTANEOUS
  Administered 2016-05-30 (×5): 4 [IU] via SUBCUTANEOUS
  Administered 2016-05-31: 3 [IU] via SUBCUTANEOUS
  Administered 2016-05-31: 4 [IU] via SUBCUTANEOUS
  Administered 2016-05-31: 7 [IU] via SUBCUTANEOUS
  Administered 2016-05-31 – 2016-06-02 (×5): 4 [IU] via SUBCUTANEOUS
  Administered 2016-06-02 (×2): 3 [IU] via SUBCUTANEOUS

## 2016-05-27 NOTE — Progress Notes (Signed)
ANTICOAGULATION CONSULT NOTE  Pharmacy Consult for Heparin  Indication: pulmonary embolus   Patient Measurements: Height: 5\' 6"  (167.6 cm) Weight: 179 lb 14.3 oz (81.6 kg) IBW/kg (Calculated) : 59.3  Vital Signs: Temp: 97.8 F (36.6 C) (10/13 2325) Temp Source: Oral (10/13 2325) BP: 133/53 (10/14 0200) Pulse Rate: 98 (10/14 0200)  Labs:  Recent Labs  05/24/16 1647 05/25/16 0706  05/26/16 0000 05/26/16 0305 05/26/16 1025 05/26/16 1400 05/27/16 0101  HGB  --  9.0*  --   --  7.5*  --  7.7*  --   HCT  --  28.4*  --   --  23.7*  --  24.1*  --   PLT  --  135*  --   --  130*  --  147*  --   LABPROT  --   --   --   --   --  18.7*  --   --   INR  --   --   --   --   --  1.55  --   --   HEPARINUNFRC 0.52 0.19*  < > 0.20*  --  0.24*  --  0.33  CREATININE 1.05* 0.93  --   --  1.17*  --   --   --   < > = values in this interval not displayed.  Estimated Creatinine Clearance: 45.4 mL/min (by C-G formula based on SCr of 1.17 mg/dL (H)).   Assessment: 75 y.o. female with PE for heparin  Goal of Therapy:  Heparin level 0.3-0.7 units/ml Monitor platelets by anticoagulation protocol: Yes   Plan:  Continue Heparin at current rate   Jazmine Heckman, Bronson Curb 05/27/2016,2:24 AM

## 2016-05-27 NOTE — Progress Notes (Signed)
ANTICOAGULATION CONSULT NOTE - Follow Up Consult  Pharmacy Consult for Heparin Indication: pulmonary embolus  Allergies  Allergen Reactions  . Methylprednisolone Acetate     Other reaction(s): Other (See Comments) CHEST PAIN  . Ace Inhibitors     Other reaction(s): Cough (ALLERGY/intolerance)  . Aspirin     Other reaction(s): Other (See Comments) PT BLEEDS TOO EASILY  . Codeine   . Cortisone     Other reaction(s): Other (See Comments) Ask pt for reaction  . Cyclobenzaprine     Other reaction(s): Vomiting (intolerance)  . Fluzone [Flu Virus Vaccine] Other (See Comments)  . Hctz [Hydrochlorothiazide]   . Hydrocodone-Acetaminophen     Other reaction(s): Confusion (intolerance)  . Ibuprofen     Other reaction(s): Hypertension (intolerance)  . Morphine And Related Nausea And Vomiting  . Naproxen Sodium     Other reaction(s): Hypertension (intolerance)    Patient Measurements: Height: 5\' 6"  (167.6 cm) Weight: 186 lb 1.1 oz (84.4 kg) IBW/kg (Calculated) : 59.3 Heparin Dosing Weight: 77 kg  Vital Signs: Temp: 97.3 F (36.3 C) (10/14 1648) Temp Source: Oral (10/14 1648) BP: 171/53 (10/14 1700) Pulse Rate: 77 (10/14 1700)  Labs:  Recent Labs  05/25/16 0706  05/26/16 0305 05/26/16 1025 05/26/16 1400 05/27/16 0101 05/27/16 0400 05/27/16 0415 05/27/16 1630  HGB 9.0*  --  7.5*  --  7.7*  --  8.6*  --   --   HCT 28.4*  --  23.7*  --  24.1*  --  26.1*  --   --   PLT 135*  --  130*  --  147*  --  151  --   --   LABPROT  --   --   --  18.7*  --   --  19.1*  --   --   INR  --   --   --  1.55  --   --  1.59  --   --   HEPARINUNFRC 0.19*  < >  --  0.24*  --  0.33 0.20*  --  0.44  CREATININE 0.93  --  1.17*  --   --   --   --  1.54*  --   < > = values in this interval not displayed.  Estimated Creatinine Clearance: 35.1 mL/min (by C-G formula based on SCr of 1.54 mg/dL (H)).   Assessment: Anticoag: Heparin for submassive PE (small) on CT (RV:LV ratio 1.2) + superficial  thrombus noted in the RLE. HL 0.44 now in goal  Goal of Therapy:  Heparin level 0.3-0.7 units/ml Monitor platelets by anticoagulation protocol: Yes   Plan:  Heparin 1850 units/hr, confirm in 6 hrs. Daily HL and CBC   Hobson Lax S. Alford Highland, PharmD, BCPS Clinical Staff Pharmacist Pager [phone]  Eilene Ghazi Stillinger 05/27/2016,5:55 PM
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
## 2016-05-27 NOTE — Progress Notes (Signed)
Echocardiogram 2D Echocardiogram has been performed.  Marie [name] 05/27/2016, 2:45 PM

## 2016-05-27 NOTE — Progress Notes (Signed)
SUBJECTIVE: The patient is doing well today. No new concerns.  She converted to sinus overnight.  . bacitracin   Topical BID  .  ceFAZolin (ANCEF) IV  2 g Intravenous Q8H  . docusate sodium  100 mg Oral BID  . free water  200 mL Per Tube Q8H  . insulin aspart  0-20 Units Subcutaneous Q4H  . insulin NPH Human  10 Units Subcutaneous BID  . ipratropium-albuterol  3 mL Nebulization Q6H  . levothyroxine  88 mcg Oral QAC breakfast  . loratadine  10 mg Oral QHS  . mouth rinse  15 mL Mouth Rinse BID  . metoprolol  5 mg Intravenous Once  . metoprolol tartrate  12.5 mg Per Tube BID  . sennosides  5 mL Per Tube BID  . sertraline  100 mg Oral QHS  . traMADol  50 mg Oral Q6H   . sodium chloride 50 mL/hr at 05/27/16 1400  . amiodarone 30 mg/hr (05/27/16 1400)  . diltiazem (CARDIZEM) infusion 10 mg/hr (05/27/16 1400)  . feeding supplement (GLUCERNA 1.2 CAL) 1,000 mL (05/27/16 1400)  . heparin 1,850 Units/hr (05/27/16 1400)    OBJECTIVE: Physical Exam: Vitals:   05/27/16 1200 05/27/16 1225 05/27/16 1300 05/27/16 1400  BP: (!) 148/48  (!) 151/52 (!) 163/56  Pulse: 76  70 69  Resp: (!) 21  17 20   Temp:  97.9 F (36.6 C)    TempSrc:  Oral    SpO2: 96%  98% 98%  Weight:      Height:        Intake/Output Summary (Last 24 hours) at 05/27/16 1458 Last data filed at 05/27/16 1400  Gross per 24 hour  Intake          4159.81 ml  Output             1585 ml  Net          2574.81 ml    Telemetry reveals sinus rhythm this am  GEN- The patient is ill appearing, alert  Head- normocephalic, atraumatic Eyes-  Sclera clear, conjunctiva pink Ears- hearing intact Oropharynx- clear Neck- supple, no JVP Lungs- decreased BS Heart- Regular rate and rhythm  GI- soft, NT, ND, + BS Extremities- no clubbing, cyanosis, or edema Skin- no rash or lesion  LABS: Basic Metabolic Panel:  Recent Labs  05/26/16 0305 05/27/16 0400 05/27/16 0415  NA 143  --  141  K 3.3*  --  3.4*  CL 107  --   105  CO2 29  --  27  GLUCOSE 227*  --  281*  BUN 40*  --  55*  CREATININE 1.17*  --  1.54*  CALCIUM 8.2*  --  7.8*  MG 3.3* 3.0*  --   PHOS 2.1*  --  1.5*   Liver Function Tests:  Recent Labs  05/27/16 0415  ALBUMIN 1.7*   No results for input(s): LIPASE, AMYLASE in the last 72 hours. CBC:  Recent Labs  05/26/16 1400 05/27/16 0400  WBC 74.1* 68.6*  NEUTROABS 8.2* 7.5  HGB 7.7* 8.6*  HCT 24.1* 26.1*  MCV 95.3 95.3  PLT 147* 151   Hemoglobin A1C:  Recent Labs  05/24/16 2045  HGBA1C 6.6*   Fasting Lipid Panel: No results for input(s): CHOL, HDL, LDLCALC, TRIG, CHOLHDL, LDLDIRECT in the last 72 hours. Thyroid Function Tests:  Recent Labs  05/26/16 1400  TSH 1.173    ASSESSMENT AND PLAN:   1. afib Currently back in sinus rhythm Continue  amiodarone and diltiazem at this time Could convert to oral cardizem CD 120mg  daily once taking POs May be reasonable to keep on amiodarone 200mg  BID once taking POs to keep in sinus until her acute medical events are resolved Awaiting echo  2. PTE Heparin drip Convert to xarelto 20mg  daily once taking POs if no surgeries planned  Cardiology to see as needed over the weekend   Thompson Grayer, MD 05/27/2016 2:58 PM
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
## 2016-05-28 DIAGNOSIS — R7881 Bacteremia: Secondary | ICD-10-CM

## 2016-05-28 LAB — CBC WITH DIFFERENTIAL/PLATELET
BASOS PCT: 0 %
Basophils Absolute: 0 10*3/uL (ref 0.0–0.1)
EOS PCT: 0 %
Eosinophils Absolute: 0 10*3/uL (ref 0.0–0.7)
HEMATOCRIT: 31.3 % — AB (ref 36.0–46.0)
HEMOGLOBIN: 10.1 g/dL — AB (ref 12.0–15.0)
LYMPHS PCT: 91 %
Lymphs Abs: 49.5 10*3/uL — ABNORMAL HIGH (ref 0.7–4.0)
MCH: 30.4 pg (ref 26.0–34.0)
MCHC: 32.3 g/dL (ref 30.0–36.0)
MCV: 94.3 fL (ref 78.0–100.0)
MONOS PCT: 1 %
Monocytes Absolute: 0.5 10*3/uL (ref 0.1–1.0)
NEUTROS PCT: 8 %
Neutro Abs: 4.4 10*3/uL (ref 1.7–7.7)
Platelets: 144 10*3/uL — ABNORMAL LOW (ref 150–400)
RBC: 3.32 MIL/uL — AB (ref 3.87–5.11)
RDW: 16.7 % — ABNORMAL HIGH (ref 11.5–15.5)
WBC: 54.4 10*3/uL (ref 4.0–10.5)

## 2016-05-28 LAB — GLUCOSE, CAPILLARY
GLUCOSE-CAPILLARY: 211 mg/dL — AB (ref 65–99)
GLUCOSE-CAPILLARY: 243 mg/dL — AB (ref 65–99)
GLUCOSE-CAPILLARY: 220 mg/dL — AB (ref 65–99)
Glucose-Capillary: 186 mg/dL — ABNORMAL HIGH (ref 65–99)
Glucose-Capillary: 190 mg/dL — ABNORMAL HIGH (ref 65–99)
Glucose-Capillary: 228 mg/dL — ABNORMAL HIGH (ref 65–99)

## 2016-05-28 LAB — RENAL FUNCTION PANEL
ANION GAP: 10 (ref 5–15)
Albumin: 1.7 g/dL — ABNORMAL LOW (ref 3.5–5.0)
BUN: 53 mg/dL — ABNORMAL HIGH (ref 6–20)
CALCIUM: 7.6 mg/dL — AB (ref 8.9–10.3)
CHLORIDE: 100 mmol/L — AB (ref 101–111)
CO2: 25 mmol/L (ref 22–32)
Creatinine, Ser: 1.32 mg/dL — ABNORMAL HIGH (ref 0.44–1.00)
GFR calc Af Amer: 45 mL/min — ABNORMAL LOW (ref >60)
GFR calc non Af Amer: 39 mL/min — ABNORMAL LOW (ref >60)
GLUCOSE: 228 mg/dL — AB (ref 65–99)
POTASSIUM: 3.6 mmol/L (ref 3.5–5.1)
Phosphorus: 2.9 mg/dL (ref 2.5–4.6)
SODIUM: 135 mmol/L (ref 135–145)

## 2016-05-28 LAB — PROTIME-INR
INR: 1.28
PROTHROMBIN TIME: 16.1 s — AB (ref 11.4–15.2)

## 2016-05-28 LAB — HEPARIN LEVEL (UNFRACTIONATED)
HEPARIN UNFRACTIONATED: 0.47 [IU]/mL (ref 0.30–0.70)
Heparin Unfractionated: 0.47 IU/mL (ref 0.30–0.70)

## 2016-05-28 LAB — MAGNESIUM: Magnesium: 3 mg/dL — ABNORMAL HIGH (ref 1.7–2.4)

## 2016-05-28 MED ORDER — AMIODARONE HCL 200 MG PO TABS
200.0000 mg | ORAL_TABLET | Freq: Every day | ORAL | Status: DC
  Administered 2016-05-28 – 2016-06-02 (×6): 200 mg via ORAL
  Filled 2016-05-28: qty 2
  Filled 2016-05-28: qty 1
  Filled 2016-05-28: qty 2
  Filled 2016-05-28: qty 1
  Filled 2016-05-28 (×2): qty 2

## 2016-05-28 MED ORDER — DILTIAZEM HCL ER COATED BEADS 120 MG PO CP24
120.0000 mg | ORAL_CAPSULE | Freq: Every day | ORAL | Status: DC
  Administered 2016-05-28 – 2016-06-02 (×6): 120 mg via ORAL
  Filled 2016-05-28 (×6): qty 1

## 2016-05-28 MED ORDER — IPRATROPIUM-ALBUTEROL 0.5-2.5 (3) MG/3ML IN SOLN
3.0000 mL | Freq: Three times a day (TID) | RESPIRATORY_TRACT | Status: DC
  Administered 2016-05-28 – 2016-05-30 (×6): 3 mL via RESPIRATORY_TRACT
  Filled 2016-05-28 (×6): qty 3

## 2016-05-28 MED ORDER — INSULIN NPH (HUMAN) (ISOPHANE) 100 UNIT/ML ~~LOC~~ SUSP
20.0000 [IU] | Freq: Two times a day (BID) | SUBCUTANEOUS | Status: DC
  Administered 2016-05-28 – 2016-06-06 (×16): 20 [IU] via SUBCUTANEOUS
  Filled 2016-05-28 (×4): qty 10
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
## 2016-05-29 LAB — CBC WITH DIFFERENTIAL/PLATELET
Basophils Absolute: 0 10*3/uL (ref 0.0–0.1)
Basophils Relative: 0 %
EOS PCT: 0 %
Eosinophils Absolute: 0 10*3/uL (ref 0.0–0.7)
HEMATOCRIT: 22.3 % — AB (ref 36.0–46.0)
HEMOGLOBIN: 7.1 g/dL — AB (ref 12.0–15.0)
LYMPHS PCT: 90 %
Lymphs Abs: 69.3 10*3/uL — ABNORMAL HIGH (ref 0.7–4.0)
MCH: 30.5 pg (ref 26.0–34.0)
MCHC: 31.8 g/dL (ref 30.0–36.0)
MCV: 95.7 fL (ref 78.0–100.0)
MONOS PCT: 1 %
Monocytes Absolute: 0.8 10*3/uL (ref 0.1–1.0)
NEUTROS PCT: 9 %
Neutro Abs: 6.9 10*3/uL (ref 1.7–7.7)
Platelets: 190 10*3/uL (ref 150–400)
RBC: 2.33 MIL/uL — AB (ref 3.87–5.11)
RDW: 16 % — AB (ref 11.5–15.5)
WBC: 77 10*3/uL — AB (ref 4.0–10.5)

## 2016-05-29 LAB — RENAL FUNCTION PANEL
ANION GAP: 10 (ref 5–15)
Albumin: 1.6 g/dL — ABNORMAL LOW (ref 3.5–5.0)
BUN: 50 mg/dL — ABNORMAL HIGH (ref 6–20)
CHLORIDE: 98 mmol/L — AB (ref 101–111)
CO2: 23 mmol/L (ref 22–32)
Calcium: 7.4 mg/dL — ABNORMAL LOW (ref 8.9–10.3)
Creatinine, Ser: 1.22 mg/dL — ABNORMAL HIGH (ref 0.44–1.00)
GFR calc Af Amer: 49 mL/min — ABNORMAL LOW (ref >60)
GFR calc non Af Amer: 43 mL/min — ABNORMAL LOW (ref >60)
Glucose, Bld: 194 mg/dL — ABNORMAL HIGH (ref 65–99)
Phosphorus: 3.1 mg/dL (ref 2.5–4.6)
Potassium: 3.7 mmol/L (ref 3.5–5.1)
Sodium: 131 mmol/L — ABNORMAL LOW (ref 135–145)

## 2016-05-29 LAB — MAGNESIUM: Magnesium: 2.6 mg/dL — ABNORMAL HIGH (ref 1.7–2.4)

## 2016-05-29 LAB — GLUCOSE, CAPILLARY
GLUCOSE-CAPILLARY: 224 mg/dL — AB (ref 65–99)
GLUCOSE-CAPILLARY: 193 mg/dL — AB (ref 65–99)
Glucose-Capillary: 174 mg/dL — ABNORMAL HIGH (ref 65–99)
Glucose-Capillary: 170 mg/dL — ABNORMAL HIGH (ref 65–99)
Glucose-Capillary: 205 mg/dL — ABNORMAL HIGH (ref 65–99)
Glucose-Capillary: 169 mg/dL — ABNORMAL HIGH (ref 65–99)

## 2016-05-29 LAB — PROTIME-INR
INR: 1.21
Prothrombin Time: 15.4 seconds — ABNORMAL HIGH (ref 11.4–15.2)

## 2016-05-29 LAB — HEPARIN LEVEL (UNFRACTIONATED): Heparin Unfractionated: 0.39 IU/mL (ref 0.30–0.70)

## 2016-05-29 MED ORDER — OXYCODONE HCL 5 MG PO TABS
2.5000 mg | ORAL_TABLET | ORAL | Status: DC | PRN
  Administered 2016-05-29: 10 mg via ORAL
  Administered 2016-05-29: 5 mg via ORAL
  Administered 2016-05-30: 10 mg via ORAL
  Administered 2016-05-31 (×2): 5 mg via ORAL
  Administered 2016-06-03: 10 mg via ORAL
  Administered 2016-06-03 – 2016-06-04 (×2): 5 mg via ORAL
  Filled 2016-05-29: qty 1
  Filled 2016-05-29: qty 2
  Filled 2016-05-29: qty 1
  Filled 2016-05-29: qty 2
  Filled 2016-05-29 (×3): qty 1
  Filled 2016-05-29: qty 2
  Filled 2016-05-29: qty 1

## 2016-05-29 MED ORDER — SODIUM CHLORIDE 0.9% FLUSH
10.0000 mL | INTRAVENOUS | Status: DC | PRN
  Administered 2016-06-01: 20 mL
  Administered 2016-06-07: 10 mL
  Filled 2016-05-29 (×2): qty 40

## 2016-05-29 MED ORDER — TRAMADOL HCL 50 MG PO TABS
100.0000 mg | ORAL_TABLET | Freq: Four times a day (QID) | ORAL | Status: DC
  Administered 2016-05-29 – 2016-06-02 (×13): 100 mg via ORAL
  Filled 2016-05-29 (×16): qty 2

## 2016-05-29 MED ORDER — RIVAROXABAN 15 MG PO TABS
15.0000 mg | ORAL_TABLET | Freq: Two times a day (BID) | ORAL | Status: DC
  Administered 2016-05-29 – 2016-05-31 (×5): 15 mg via ORAL
  Filled 2016-05-29 (×6): qty 1

## 2016-05-29 MED ORDER — FENTANYL CITRATE (PF) 100 MCG/2ML IJ SOLN
12.5000 ug | INTRAMUSCULAR | Status: DC | PRN
Start: 2016-05-29 — End: 2016-06-09
  Administered 2016-06-05 – 2016-06-07 (×2): 12.5 ug via INTRAVENOUS
  Filled 2016-05-29 (×2): qty 2
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
## 2016-05-29 NOTE — Care Management Important Message (Signed)
Important Message  Patient Details  Name: Marie [name] MRN: [id number] Date of Birth: [birth year]   Medicare Important Message Given:  Yes    Solan Vosler Abena 05/29/2016, 1:58 PM
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
## 2016-05-30 ENCOUNTER — Inpatient Hospital Stay (HOSPITAL_COMMUNITY): Payer: Medicare Other

## 2016-05-30 LAB — CBC
HCT: 22.7 % — ABNORMAL LOW (ref 36.0–46.0)
HEMOGLOBIN: 7.4 g/dL — AB (ref 12.0–15.0)
MCH: 31.4 pg (ref 26.0–34.0)
MCHC: 32.6 g/dL (ref 30.0–36.0)
MCV: 96.2 fL (ref 78.0–100.0)
Platelets: 253 10*3/uL (ref 150–400)
RBC: 2.36 MIL/uL — ABNORMAL LOW (ref 3.87–5.11)
RDW: 15.5 % (ref 11.5–15.5)
WBC: 83.6 10*3/uL (ref 4.0–10.5)

## 2016-05-30 LAB — GLUCOSE, CAPILLARY
GLUCOSE-CAPILLARY: 183 mg/dL — AB (ref 65–99)
GLUCOSE-CAPILLARY: 191 mg/dL — AB (ref 65–99)
Glucose-Capillary: 173 mg/dL — ABNORMAL HIGH (ref 65–99)
Glucose-Capillary: 157 mg/dL — ABNORMAL HIGH (ref 65–99)
Glucose-Capillary: 142 mg/dL — ABNORMAL HIGH (ref 65–99)
Glucose-Capillary: 178 mg/dL — ABNORMAL HIGH (ref 65–99)

## 2016-05-30 LAB — PATHOLOGIST SMEAR REVIEW

## 2016-05-30 MED ORDER — IPRATROPIUM-ALBUTEROL 0.5-2.5 (3) MG/3ML IN SOLN: 3.0000 mL | RESPIRATORY_TRACT | Status: DC | PRN

## 2016-05-30 MED ORDER — METOPROLOL TARTRATE 12.5 MG HALF TABLET
12.5000 mg | ORAL_TABLET | Freq: Two times a day (BID) | ORAL | Status: DC
  Administered 2016-05-30 – 2016-06-02 (×8): 12.5 mg via ORAL
  Filled 2016-05-30 (×8): qty 1

## 2016-05-30 NOTE — Progress Notes (Signed)
Her pain is well controlled, no C/o.  The LUE there is increased necrotic tissue with active serous drainaige from her dorsal forearm wounds.    Plan:  I have spoken with Dr. Marla Roe who will evaluate wound. Continue dry dressings PRN.   Aryeh Butterfield D

## 2016-05-30 NOTE — Progress Notes (Signed)
Paged Ambrose Finland Pa-C about patients xray results, he stated he would take a look at the results.
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
## 2016-05-31 ENCOUNTER — Ambulatory Visit: Payer: Self-pay | Admitting: Plastic Surgery

## 2016-05-31 DIAGNOSIS — S41102A Unspecified open wound of left upper arm, initial encounter: Secondary | ICD-10-CM

## 2016-05-31 LAB — BASIC METABOLIC PANEL
Anion gap: 8 (ref 5–15)
BUN: 44 mg/dL — AB (ref 6–20)
CALCIUM: 7.5 mg/dL — AB (ref 8.9–10.3)
CHLORIDE: 96 mmol/L — AB (ref 101–111)
CO2: 25 mmol/L (ref 22–32)
CREATININE: 1.17 mg/dL — AB (ref 0.44–1.00)
GFR calc non Af Amer: 45 mL/min — ABNORMAL LOW (ref >60)
GFR, EST AFRICAN AMERICAN: 52 mL/min — AB (ref >60)
Glucose, Bld: 207 mg/dL — ABNORMAL HIGH (ref 65–99)
Potassium: 4.2 mmol/L (ref 3.5–5.1)
Sodium: 129 mmol/L — ABNORMAL LOW (ref 135–145)

## 2016-05-31 LAB — GLUCOSE, CAPILLARY
GLUCOSE-CAPILLARY: 188 mg/dL — AB (ref 65–99)
GLUCOSE-CAPILLARY: 131 mg/dL — AB (ref 65–99)
GLUCOSE-CAPILLARY: 154 mg/dL — AB (ref 65–99)
Glucose-Capillary: 156 mg/dL — ABNORMAL HIGH (ref 65–99)
Glucose-Capillary: 184 mg/dL — ABNORMAL HIGH (ref 65–99)
Glucose-Capillary: 195 mg/dL — ABNORMAL HIGH (ref 65–99)
Glucose-Capillary: 204 mg/dL — ABNORMAL HIGH (ref 65–99)

## 2016-05-31 MED ORDER — CHLORHEXIDINE GLUCONATE CLOTH 2 % EX PADS
6.0000 | MEDICATED_PAD | Freq: Once | CUTANEOUS | Status: AC
  Administered 2016-05-31: 6 via TOPICAL

## 2016-05-31 MED ORDER — FREE WATER
100.0000 mL | Freq: Three times a day (TID) | Status: DC
  Administered 2016-05-31 – 2016-06-07 (×18): 100 mL

## 2016-05-31 MED ORDER — SODIUM CHLORIDE 0.9 % IV BOLUS (SEPSIS)
500.0000 mL | Freq: Once | INTRAVENOUS | Status: AC
  Administered 2016-05-31: 500 mL via INTRAVENOUS

## 2016-05-31 MED ORDER — NEOSTIGMINE METHYLSULFATE 10 MG/10ML IV SOLN
2.0000 mg | Freq: Once | INTRAVENOUS | Status: AC
  Administered 2016-05-31: 2 mg via INTRAVENOUS
  Filled 2016-05-31: qty 2

## 2016-05-31 MED ORDER — CEFAZOLIN SODIUM-DEXTROSE 2-4 GM/100ML-% IV SOLN
2.0000 g | INTRAVENOUS | Status: DC
  Filled 2016-05-31 (×2): qty 100

## 2016-05-31 MED ORDER — DEXTROSE IN LACTATED RINGERS 5 % IV SOLN: INTRAVENOUS | Status: DC

## 2016-05-31 MED ORDER — CLOTRIMAZOLE 10 MG MT TROC
10.0000 mg | Freq: Every day | OROMUCOSAL | Status: DC
  Administered 2016-05-31 – 2016-06-04 (×16): 10 mg via ORAL
  Filled 2016-05-31 (×21): qty 1

## 2016-05-31 MED ORDER — LACTATED RINGERS IV SOLN
INTRAVENOUS | Status: DC
  Administered 2016-05-31 – 2016-06-02 (×3): via INTRAVENOUS
  Administered 2016-06-03: 1 mL via INTRAVENOUS

## 2016-05-31 NOTE — Progress Notes (Signed)
All dressing changes done this am at 0530.  Patient did not void on her own on night shift; bladder scan showed 570 mL of urine; I/O catheter done at 0530 removed 1000 mL of urine.

## 2016-05-31 NOTE — Progress Notes (Signed)
PT Cancellation Note  Patient Details Name: Marie [name] MRN: [id number] DOB: 11/13/1940   Cancelled Treatment:    Reason Eval/Treat Not Completed: Pain limiting ability to participate (PTA into adjust brace on cervical spine and R arm, pt refused mobility and reports she will participate at 2:30 when pain meds begin to work.  Will f/u later.  )   Marie [name] 05/31/2016, 2:31 PM Governor Rooks, PTA pager [phone]

## 2016-05-31 NOTE — Progress Notes (Signed)
Patient given Bloxiverz, rapid reponse Rn started pushing medication at 1521 finished giving at 1527, vitals checked and documented during and after administration of medication.

## 2016-05-31 NOTE — Progress Notes (Signed)
Patient was available today when stopped by, and offered spiritual/emotional support and prayer. Patient self-identified as Marie [name], and said any kind of prayer was good. Chaplain available for follow-up as needed.   05/31/16 1600  Clinical Encounter Type  Visited With Patient  Visit Type Follow-up;Psychological support;Spiritual support;Post-op  Referral From Chaplain  Spiritual Encounters  Spiritual Needs Prayer;Emotional  Stress Factors  Patient Stress Factors Health changes
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
## 2016-05-31 NOTE — Progress Notes (Addendum)
Called for Neostigmine administration, patient given 2mg  over 5 minutes, VS charted and stable, patient was hypertensive prior to and during the first few minutes (just got back to bed after working with PT), but BP normalized per RN and HR remained above 60, patient is on tele, placed on 4L of oxygen (patient needed to cleaned up and laid flat, sats 88%, improved with oxygen).  Called at 1620 for update, patient stable, nausea improved.
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
## 2016-06-01 ENCOUNTER — Inpatient Hospital Stay (HOSPITAL_COMMUNITY): Payer: Medicare Other

## 2016-06-01 ENCOUNTER — Encounter (HOSPITAL_COMMUNITY): Payer: Self-pay | Admitting: Anesthesiology

## 2016-06-01 ENCOUNTER — Encounter (HOSPITAL_COMMUNITY): Payer: Self-pay | Admitting: Plastic Surgery

## 2016-06-01 ENCOUNTER — Encounter (HOSPITAL_COMMUNITY): Admission: EM | Disposition: E | Payer: Self-pay | Source: Home / Self Care

## 2016-06-01 DIAGNOSIS — R339 Retention of urine, unspecified: Secondary | ICD-10-CM | POA: Diagnosis not present

## 2016-06-01 DIAGNOSIS — E871 Hypo-osmolality and hyponatremia: Secondary | ICD-10-CM | POA: Diagnosis not present

## 2016-06-01 LAB — SURGICAL PCR SCREEN
MRSA, PCR: NEGATIVE
Staphylococcus aureus: POSITIVE — AB

## 2016-06-01 LAB — BASIC METABOLIC PANEL
ANION GAP: 8 (ref 5–15)
BUN: 39 mg/dL — ABNORMAL HIGH (ref 6–20)
CALCIUM: 7.4 mg/dL — AB (ref 8.9–10.3)
CO2: 26 mmol/L (ref 22–32)
Chloride: 97 mmol/L — ABNORMAL LOW (ref 101–111)
Creatinine, Ser: 1.08 mg/dL — ABNORMAL HIGH (ref 0.44–1.00)
GFR, EST AFRICAN AMERICAN: 57 mL/min — AB (ref >60)
GFR, EST NON AFRICAN AMERICAN: 49 mL/min — AB (ref >60)
Glucose, Bld: 150 mg/dL — ABNORMAL HIGH (ref 65–99)
Potassium: 4.3 mmol/L (ref 3.5–5.1)
SODIUM: 131 mmol/L — AB (ref 135–145)

## 2016-06-01 LAB — GLUCOSE, CAPILLARY
GLUCOSE-CAPILLARY: 180 mg/dL — AB (ref 65–99)
GLUCOSE-CAPILLARY: 122 mg/dL — AB (ref 65–99)
GLUCOSE-CAPILLARY: 95 mg/dL (ref 65–99)
GLUCOSE-CAPILLARY: 87 mg/dL (ref 65–99)
Glucose-Capillary: 152 mg/dL — ABNORMAL HIGH (ref 65–99)
Glucose-Capillary: 65 mg/dL (ref 65–99)
Glucose-Capillary: 71 mg/dL (ref 65–99)
Glucose-Capillary: 84 mg/dL (ref 65–99)

## 2016-06-01 LAB — CBC
HCT: 20.4 % — ABNORMAL LOW (ref 36.0–46.0)
Hemoglobin: 6.5 g/dL — CL (ref 12.0–15.0)
MCH: 30.9 pg (ref 26.0–34.0)
MCHC: 31.4 g/dL (ref 30.0–36.0)
MCV: 98.6 fL (ref 78.0–100.0)
PLATELETS: 317 10*3/uL (ref 150–400)
RBC: 2.07 MIL/uL — AB (ref 3.87–5.11)
RDW: 16.3 % — AB (ref 11.5–15.5)
WBC: 72.1 10*3/uL — AB (ref 4.0–10.5)

## 2016-06-01 LAB — PREPARE RBC (CROSSMATCH)

## 2016-06-01 LAB — ABO/RH: ABO/RH(D): A NEG

## 2016-06-01 LAB — MAGNESIUM: Magnesium: 2.4 mg/dL (ref 1.7–2.4)

## 2016-06-01 SURGERY — IRRIGATION AND DEBRIDEMENT WOUND
Anesthesia: General | Laterality: Left

## 2016-06-01 MED ORDER — CALCIUM CARBONATE ANTACID 500 MG PO CHEW
400.0000 mg | CHEWABLE_TABLET | Freq: Three times a day (TID) | ORAL | Status: DC
  Administered 2016-06-02: 400 mg via ORAL
  Filled 2016-06-01 (×2): qty 2

## 2016-06-01 MED ORDER — SENNA 8.6 MG PO TABS
1.0000 | ORAL_TABLET | Freq: Two times a day (BID) | ORAL | Status: DC
  Administered 2016-06-01 (×2): 8.6 mg via ORAL
  Filled 2016-06-01 (×2): qty 1

## 2016-06-01 MED ORDER — VALACYCLOVIR HCL 500 MG PO TABS
1000.0000 mg | ORAL_TABLET | Freq: Two times a day (BID) | ORAL | Status: DC
  Administered 2016-06-01 – 2016-06-02 (×4): 1000 mg via ORAL
  Filled 2016-06-01 (×5): qty 2

## 2016-06-01 MED ORDER — SODIUM CHLORIDE 0.9 % IV SOLN: Freq: Once | INTRAVENOUS | Status: DC

## 2016-06-01 MED ORDER — CALCIUM CARBONATE ANTACID 1250 MG/5ML PO SUSP
500.0000 mg | Freq: Three times a day (TID) | ORAL | Status: DC
  Filled 2016-06-01 (×3): qty 5

## 2016-06-01 MED ORDER — WHITE PETROLATUM GEL
Status: AC
  Filled 2016-06-01: qty 1

## 2016-06-01 MED ORDER — LACTATED RINGERS IV SOLN
INTRAVENOUS | Status: DC
  Administered 2016-06-04: 1 mL via INTRAVENOUS

## 2016-06-01 MED ORDER — CEFAZOLIN SODIUM-DEXTROSE 2-4 GM/100ML-% IV SOLN: 2.0000 g | INTRAVENOUS | Status: DC

## 2016-06-01 MED ORDER — GLUCOSE 40 % PO GEL
ORAL | Status: AC
  Administered 2016-06-01: 37.5 g
  Filled 2016-06-01: qty 1

## 2016-06-01 NOTE — Progress Notes (Signed)
Report received from [facility], reported that tube feeding was stopped at 1000 today. Dr Gifford Shave informed. Dr Gifford Shave states we will have to wait until 1600 to go back to surgery. Pt infromed.
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
## 2016-06-01 NOTE — Interval H&P Note (Signed)
History and Physical Interval Note:  06/07/2016 1:34 PM  Marie [name]  has presented today for surgery, with the diagnosis of left forearm wound  The various methods of treatment have been discussed with the patient and family. After consideration of risks, benefits and other options for treatment, the patient has consented to  Procedure(s): IRRIGATION AND DEBRIDEMENT WOUND left forearm (Left) APPLICATION OF A-CELL of  left forearm (Left) APPLICATION OF WOUND VAC  left forearm (Left) as a surgical intervention .  The patient's history has been reviewed, patient examined, no change in status, stable for surgery.  I have reviewed the patient's chart and labs.  Questions were answered to the patient's satisfaction.     Wallace Going

## 2016-06-01 NOTE — Anesthesia Preprocedure Evaluation (Deleted)
Anesthesia Evaluation  Patient identified by MRN, date of birth, ID band Patient awake    Reviewed: Allergy & Precautions, NPO status , Patient's Chart, lab work & pertinent test results  Airway Mallampati: II  TM Distance: >3 FB Neck ROM: Full  Mouth opening: Limited Mouth Opening  Dental  (+) Teeth Intact, Dental Advisory Given   Pulmonary PE   Pulmonary exam normal breath sounds clear to auscultation       Cardiovascular Normal cardiovascular exam+ dysrhythmias Atrial Fibrillation  Rhythm:Regular Rate:Normal  2D Echo 05/27/16 Study Conclusions  - Left ventricle: The cavity size was normal. Wall thickness wasincreased in a pattern of mild LVH. Systolic function wasvigorous. The estimated ejection fraction was in the range of 65%to 70%. Wall motion was normal; there were no regional wallmotion abnormalities. Left ventricular diastolic functionparameters were normal. - Aortic valve: There was mild regurgitation. - Left atrium: The atrium was mildly to moderately dilated.   Neuro/Psych C-collar    GI/Hepatic negative GI ROS, Neg liver ROS,   Endo/Other  Hyperthyroidism Obesity   Renal/GU negative Renal ROS     Musculoskeletal negative musculoskeletal ROS (+)   Abdominal   Peds  Hematology  (+) Blood dyscrasia (Xarelto), , CLL   Anesthesia Other Findings Day of surgery medications reviewed with the patient.  Reproductive/Obstetrics                            Anesthesia Physical Anesthesia Plan  ASA: III  Anesthesia Plan: General   Post-op Pain Management:    Induction: Intravenous  Airway Management Planned: Oral ETT and Video Laryngoscope Planned  Additional Equipment:   Intra-op Plan:   Post-operative Plan: Extubation in OR  Informed Consent: I have reviewed the patients History and Physical, chart, labs and discussed the procedure including the risks, benefits and  alternatives for the proposed anesthesia with the patient or authorized representative who has indicated his/her understanding and acceptance.   Dental advisory given  Plan Discussed with: CRNA  Anesthesia Plan Comments: (Risks/benefits of general anesthesia discussed with patient including risk of damage to teeth, lips, gum, and tongue, nausea/vomiting, allergic reactions to medications, and the possibility of heart attack, stroke and death.  All patient questions answered.  Patient wishes to proceed.  NG feeds cut off at 1000.  Will wait 6 hours (1600) before proceeding to OR.)       Anesthesia Quick Evaluation
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
## 2016-06-01 NOTE — Progress Notes (Signed)
CRITICAL VALUE ALERT  Critical value received: Hemoglobin 6.5  Date of notification:  05/21/2016  Time of notification:  0812  Critical value read back:Yes.    Nurse who received alert:  Judieth Keens RN  MD notified (1st page):  Legrand Como, Trauma PA  Time of first page:  0813  MD notified (2nd page):  Time of second page:  Responding MD:  Legrand Como, Trauma PA  Time MD responded:  [phone]

## 2016-06-01 NOTE — Progress Notes (Signed)
Hypoglycemic Event  CBG: 65  Treatment: 15 GM gel  Symptoms: None  Follow-up CBG: Time:1750 CBG Result:87  Possible Reasons for Event: Other: Patient was NPO for surgery which was canceled, now receiving tube feeding refuses to eat/drink  Comments/MD notified:Trauma notified    Marie [name]
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
## 2016-06-01 NOTE — Consult Note (Signed)
WOC follow-up: Dr Marla Roe of the plastics team is now following for assessment and plan of care to left arm.  She plans to take the patient to the OR for debridement, according to the EMR.  Please refer to their team for further questions. Please re-consult if further assistance is needed.  Thank-you,  Julien Girt MSN, [place], [place], [place], [place]

## 2016-06-01 NOTE — Progress Notes (Signed)
Patient has CBG of 71, tube feeding is in progress.  Will also give 15g CHO and recheck in 15 minutes.  Hypoglycemia standing orders are for less than 70, but will proceed with 15g CHO in the form of thickened juice.

## 2016-06-02 ENCOUNTER — Ambulatory Visit: Payer: Self-pay | Admitting: Plastic Surgery

## 2016-06-02 ENCOUNTER — Inpatient Hospital Stay (HOSPITAL_COMMUNITY): Payer: Medicare Other

## 2016-06-02 DIAGNOSIS — S41102A Unspecified open wound of left upper arm, initial encounter: Secondary | ICD-10-CM

## 2016-06-02 LAB — GLUCOSE, CAPILLARY
GLUCOSE-CAPILLARY: 114 mg/dL — AB (ref 65–99)
GLUCOSE-CAPILLARY: 118 mg/dL — AB (ref 65–99)
GLUCOSE-CAPILLARY: 128 mg/dL — AB (ref 65–99)
GLUCOSE-CAPILLARY: 145 mg/dL — AB (ref 65–99)
Glucose-Capillary: 112 mg/dL — ABNORMAL HIGH (ref 65–99)
Glucose-Capillary: 155 mg/dL — ABNORMAL HIGH (ref 65–99)
Glucose-Capillary: 136 mg/dL — ABNORMAL HIGH (ref 65–99)

## 2016-06-02 LAB — TYPE AND SCREEN
ABO/RH(D): A NEG
Antibody Screen: NEGATIVE
Unit division: 0

## 2016-06-02 LAB — BASIC METABOLIC PANEL
ANION GAP: 6 (ref 5–15)
BUN: 32 mg/dL — AB (ref 6–20)
CALCIUM: 7.4 mg/dL — AB (ref 8.9–10.3)
CO2: 27 mmol/L (ref 22–32)
CREATININE: 0.98 mg/dL (ref 0.44–1.00)
Chloride: 99 mmol/L — ABNORMAL LOW (ref 101–111)
GFR calc Af Amer: >60 mL/min (ref >60)
GFR calc non Af Amer: 55 mL/min — ABNORMAL LOW (ref >60)
GLUCOSE: 135 mg/dL — AB (ref 65–99)
Potassium: 4.5 mmol/L (ref 3.5–5.1)
Sodium: 132 mmol/L — ABNORMAL LOW (ref 135–145)

## 2016-06-02 LAB — CBC
HEMATOCRIT: 23.4 % — AB (ref 36.0–46.0)
Hemoglobin: 7.5 g/dL — ABNORMAL LOW (ref 12.0–15.0)
MCH: 30.4 pg (ref 26.0–34.0)
MCHC: 32.1 g/dL (ref 30.0–36.0)
MCV: 94.7 fL (ref 78.0–100.0)
Platelets: 339 10*3/uL (ref 150–400)
RBC: 2.47 MIL/uL — ABNORMAL LOW (ref 3.87–5.11)
RDW: 20.2 % — AB (ref 11.5–15.5)
WBC: 64.1 10*3/uL (ref 4.0–10.5)

## 2016-06-02 MED ORDER — DEXTROSE-NACL 5-0.9 % IV SOLN
INTRAVENOUS | Status: DC
  Administered 2016-06-03: via INTRAVENOUS
  Filled 2016-06-02 (×2): qty 1000

## 2016-06-02 MED ORDER — BETHANECHOL CHLORIDE 25 MG PO TABS
25.0000 mg | ORAL_TABLET | Freq: Three times a day (TID) | ORAL | Status: DC
  Administered 2016-06-02 – 2016-06-06 (×15): 25 mg via NASOGASTRIC
  Filled 2016-06-02 (×2): qty 1
  Filled 2016-06-02 (×2): qty 3
  Filled 2016-06-02 (×7): qty 1
  Filled 2016-06-02: qty 2.5
  Filled 2016-06-02 (×2): qty 1
  Filled 2016-06-02 (×3): qty 3

## 2016-06-02 NOTE — Care Management Important Message (Signed)
Important Message  Patient Details  Name: Marie [name] MRN: [id number] Date of Birth: 1941/04/20   Medicare Important Message Given:  Yes    Vencil Basnett Abena 06/02/2016, 11:15 AM

## 2016-06-02 NOTE — Progress Notes (Signed)
SLP Cancellation Note  Patient Details Name: MIRRAH STRIANO MRN: [id number] DOB: 1941-08-08   Cancelled treatment:       Reason Eval/Treat Not Completed: Patient at procedure or test/unavailable   Germain Osgood 06/02/2016, 2:00 PM   Germain Osgood, M.A. CCC-SLP [phone]

## 2016-06-02 NOTE — Progress Notes (Addendum)
1 Day Post-Op  Subjective: Surgery was postponed from yesterday to tomorrow (Saturday) due to tube feeds still running.  Objective: Vital signs in last 24 hours: Temp:  [97.7 F (36.5 C)-98.7 F (37.1 C)] 97.7 F (36.5 C) (10/20 0700) Pulse Rate:  [73-81] 79 (10/20 0700) Resp:  [16-19] 17 (10/20 0700) BP: (168-176)/(43-57) 170/54 (10/20 0700) SpO2:  [95 %-100 %] 100 % (10/20 0700) Last BM Date: 05/18/2016  Intake/Output from previous day: 10/19 0701 - 10/20 0700 In: 1200 [I.V.:600; Blood:335; NG/GT:65; IV Piggyback:200] Out: 2475 [Urine:2475] Intake/Output this shift: No intake/output data recorded.  General appearance: fatigued  Lab Results:   Recent Labs  05/23/2016 0500 06/02/16 0500  WBC 72.1* 64.1*  HGB 6.5* 7.5*  HCT 20.4* 23.4*  PLT 317 339   BMET  Recent Labs  05/21/2016 0500 06/02/16 0500  NA 131* 132*  K 4.3 4.5  CL 97* 99*  CO2 26 27  GLUCOSE 150* 135*  BUN 39* 32*  CREATININE 1.08* 0.98  CALCIUM 7.4* 7.4*   PT/INR No results for input(s): LABPROT, INR in the last 72 hours. ABG No results for input(s): PHART, HCO3 in the last 72 hours.  Invalid input(s): PCO2, PO2  Studies/Results: Dg Abd Portable 1v  Result Date: 05/26/2016 CLINICAL DATA:  Ogilvie syndrome, abdominal distension EXAM: PORTABLE ABDOMEN - 1 VIEW COMPARISON:  05/30/2016 FINDINGS: Persistent diffuse gaseous distension of the colon consistent with significant colonic ileus. NG feeding tube with tip in distal stomach. Some colonic stool noted in right colon. Normal small bowel gas pattern. IMPRESSION: Persistent diffuse gaseous distension of the colon consistent with significant colonic ileus. NG feeding tube with tip in distal stomach. Some colonic stool noted in right colon. Normal small bowel gas pattern. Electronically Signed   By: Lahoma Crocker M.D.   On: 05/26/2016 08:46    Anti-infectives: Anti-infectives    Start     Dose/Rate Route Frequency Ordered Stop   05/16/2016 1400   ceFAZolin (ANCEF) IVPB 2g/100 mL premix  Status:  Discontinued     2 g 200 mL/hr over 30 Minutes Intravenous To Short Stay 06/07/2016 0538 05/30/2016 1513   06/04/2016 1000  valACYclovir (VALTREX) tablet 1,000 mg     1,000 mg Oral 2 times daily 06/11/2016 0934 06/11/16 0959   06/06/2016 0600  ceFAZolin (ANCEF) IVPB 2g/100 mL premix  Status:  Discontinued     2 g 200 mL/hr over 30 Minutes Intravenous On call to O.R. 05/31/16 1948 06/07/2016 0538   05/26/16 1800  ceFAZolin (ANCEF) IVPB 2g/100 mL premix     2 g 200 mL/hr over 30 Minutes Intravenous Every 8 hours 05/26/16 1417     05/24/16 0800  vancomycin (VANCOCIN) IVPB 1000 mg/200 mL premix  Status:  Discontinued     1,000 mg 200 mL/hr over 60 Minutes Intravenous Every 12 hours 05/23/16 1919 05/24/16 0936   05/24/16 0200  piperacillin-tazobactam (ZOSYN) IVPB 3.375 g  Status:  Discontinued     3.375 g 12.5 mL/hr over 240 Minutes Intravenous Every 8 hours 05/23/16 1919 05/26/16 1417   05/23/16 2000  vancomycin (VANCOCIN) 1,500 mg in sodium chloride 0.9 % 500 mL IVPB     1,500 mg 250 mL/hr over 120 Minutes Intravenous  Once 05/23/16 1919 05/23/16 2229   05/23/16 2000  piperacillin-tazobactam (ZOSYN) IVPB 3.375 g     3.375 g 100 mL/hr over 30 Minutes Intravenous  Once 05/23/16 1919 05/23/16 2102      Assessment/Plan: s/p Procedure(s): IRRIGATION AND DEBRIDEMENT WOUND left forearm (Left)  APPLICATION OF A-CELL of  left forearm (Left) APPLICATION OF WOUND VAC  left forearm (Left) Surgery tomorrow morning if patient able.  LOS: 14 days    Wallace Going 06/02/2016
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
## 2016-06-03 ENCOUNTER — Encounter (HOSPITAL_COMMUNITY): Admission: EM | Disposition: E | Payer: Self-pay | Source: Home / Self Care

## 2016-06-03 ENCOUNTER — Encounter (HOSPITAL_COMMUNITY): Payer: Self-pay | Admitting: Plastic Surgery

## 2016-06-03 ENCOUNTER — Inpatient Hospital Stay (HOSPITAL_COMMUNITY): Payer: Medicare Other | Admitting: Anesthesiology

## 2016-06-03 HISTORY — PX: I&D EXTREMITY: SHX5045

## 2016-06-03 LAB — BASIC METABOLIC PANEL
Anion gap: 6 (ref 5–15)
BUN: 24 mg/dL — ABNORMAL HIGH (ref 6–20)
CHLORIDE: 97 mmol/L — AB (ref 101–111)
CO2: 27 mmol/L (ref 22–32)
Calcium: 7.5 mg/dL — ABNORMAL LOW (ref 8.9–10.3)
Creatinine, Ser: 0.85 mg/dL (ref 0.44–1.00)
GFR calc non Af Amer: >60 mL/min (ref >60)
Glucose, Bld: 127 mg/dL — ABNORMAL HIGH (ref 65–99)
POTASSIUM: 4.5 mmol/L (ref 3.5–5.1)
SODIUM: 130 mmol/L — AB (ref 135–145)

## 2016-06-03 LAB — GLUCOSE, CAPILLARY
GLUCOSE-CAPILLARY: 113 mg/dL — AB (ref 65–99)
GLUCOSE-CAPILLARY: 166 mg/dL — AB (ref 65–99)
GLUCOSE-CAPILLARY: 139 mg/dL — AB (ref 65–99)
Glucose-Capillary: 129 mg/dL — ABNORMAL HIGH (ref 65–99)
Glucose-Capillary: 157 mg/dL — ABNORMAL HIGH (ref 65–99)
Glucose-Capillary: 130 mg/dL — ABNORMAL HIGH (ref 65–99)

## 2016-06-03 LAB — CULTURE, BLOOD (ROUTINE X 2): CULTURE: NO GROWTH

## 2016-06-03 SURGERY — IRRIGATION AND DEBRIDEMENT EXTREMITY
Anesthesia: General | Site: Arm Lower | Laterality: Left

## 2016-06-03 MED ORDER — PROPOFOL 10 MG/ML IV BOLUS
INTRAVENOUS | Status: AC
  Filled 2016-06-03: qty 20

## 2016-06-03 MED ORDER — DIPHENHYDRAMINE HCL 50 MG/ML IJ SOLN
INTRAMUSCULAR | Status: AC
  Filled 2016-06-03: qty 1

## 2016-06-03 MED ORDER — SERTRALINE HCL 100 MG PO TABS
100.0000 mg | ORAL_TABLET | Freq: Every day | ORAL | Status: DC
  Administered 2016-06-03 – 2016-06-08 (×6): 100 mg
  Filled 2016-06-03 (×6): qty 1

## 2016-06-03 MED ORDER — PROPOFOL 10 MG/ML IV BOLUS
INTRAVENOUS | Status: DC | PRN
  Administered 2016-06-03: 20 mg via INTRAVENOUS
  Administered 2016-06-03: 120 mg via INTRAVENOUS
  Administered 2016-06-03 (×3): 20 mg via INTRAVENOUS

## 2016-06-03 MED ORDER — LEVOTHYROXINE SODIUM 88 MCG PO TABS
88.0000 ug | ORAL_TABLET | Freq: Every day | ORAL | Status: DC
  Administered 2016-06-04 – 2016-06-08 (×5): 88 ug
  Filled 2016-06-03 (×5): qty 1

## 2016-06-03 MED ORDER — DIPHENHYDRAMINE HCL 50 MG/ML IJ SOLN
50.0000 mg | Freq: Once | INTRAMUSCULAR | Status: AC
  Administered 2016-06-03: 50 mg via INTRAVENOUS

## 2016-06-03 MED ORDER — ORAL CARE MOUTH RINSE
15.0000 mL | Freq: Two times a day (BID) | OROMUCOSAL | Status: DC
  Administered 2016-06-03 – 2016-06-08 (×8): 15 mL via OROMUCOSAL

## 2016-06-03 MED ORDER — MEPERIDINE HCL 25 MG/ML IJ SOLN: 6.2500 mg | INTRAMUSCULAR | Status: DC | PRN

## 2016-06-03 MED ORDER — FENTANYL CITRATE (PF) 100 MCG/2ML IJ SOLN
INTRAMUSCULAR | Status: AC
  Filled 2016-06-03: qty 2

## 2016-06-03 MED ORDER — ONDANSETRON HCL 4 MG/2ML IJ SOLN
INTRAMUSCULAR | Status: DC | PRN
  Administered 2016-06-03: 4 mg via INTRAVENOUS

## 2016-06-03 MED ORDER — LIDOCAINE 2% (20 MG/ML) 5 ML SYRINGE
INTRAMUSCULAR | Status: AC
  Filled 2016-06-03: qty 5

## 2016-06-03 MED ORDER — SODIUM CHLORIDE 0.9 % IR SOLN
Status: DC | PRN
  Administered 2016-06-03: 1000 mL

## 2016-06-03 MED ORDER — LIDOCAINE 2% (20 MG/ML) 5 ML SYRINGE
INTRAMUSCULAR | Status: DC | PRN
  Administered 2016-06-03: 100 mg via INTRAVENOUS

## 2016-06-03 MED ORDER — LORATADINE 10 MG PO TABS
10.0000 mg | ORAL_TABLET | Freq: Every day | ORAL | Status: DC
  Administered 2016-06-03 – 2016-06-08 (×6): 10 mg
  Filled 2016-06-03 (×6): qty 1

## 2016-06-03 MED ORDER — POLYMYXIN B SULFATE 500000 UNITS IJ SOLR
INTRAMUSCULAR | Status: DC | PRN
  Administered 2016-06-03: 500 mL

## 2016-06-03 MED ORDER — METOPROLOL TARTRATE 25 MG/10 ML ORAL SUSPENSION
12.5000 mg | Freq: Two times a day (BID) | ORAL | Status: DC
  Administered 2016-06-03 – 2016-06-06 (×6): 12.5 mg
  Filled 2016-06-03 (×6): qty 10

## 2016-06-03 MED ORDER — ONDANSETRON HCL 4 MG/2ML IJ SOLN
INTRAMUSCULAR | Status: AC
  Filled 2016-06-03: qty 2

## 2016-06-03 MED ORDER — SUCCINYLCHOLINE CHLORIDE 200 MG/10ML IV SOSY
PREFILLED_SYRINGE | INTRAVENOUS | Status: AC
  Filled 2016-06-03: qty 10

## 2016-06-03 MED ORDER — SUCCINYLCHOLINE CHLORIDE 200 MG/10ML IV SOSY
PREFILLED_SYRINGE | INTRAVENOUS | Status: DC | PRN
  Administered 2016-06-03: 100 mg via INTRAVENOUS

## 2016-06-03 MED ORDER — VALACYCLOVIR HCL 500 MG PO TABS
1000.0000 mg | ORAL_TABLET | Freq: Two times a day (BID) | ORAL | Status: DC
  Administered 2016-06-03 – 2016-06-08 (×10): 1000 mg
  Filled 2016-06-03 (×12): qty 2

## 2016-06-03 MED ORDER — AMIODARONE HCL 200 MG PO TABS
200.0000 mg | ORAL_TABLET | Freq: Every day | ORAL | Status: DC
  Administered 2016-06-04 – 2016-06-08 (×5): 200 mg
  Filled 2016-06-03 (×5): qty 1

## 2016-06-03 MED ORDER — HYDROMORPHONE HCL 1 MG/ML IJ SOLN
INTRAMUSCULAR | Status: AC
  Administered 2016-06-03: 0.5 mg via INTRAVENOUS
  Filled 2016-06-03: qty 0.5

## 2016-06-03 MED ORDER — DEXTROSE-NACL 5-0.9 % IV SOLN
INTRAVENOUS | Status: DC | PRN
  Administered 2016-06-03: 08:00:00 via INTRAVENOUS

## 2016-06-03 MED ORDER — RIVAROXABAN 15 MG PO TABS
15.0000 mg | ORAL_TABLET | Freq: Two times a day (BID) | ORAL | Status: DC
  Administered 2016-06-03 – 2016-06-08 (×11): 15 mg via ORAL
  Filled 2016-06-03 (×11): qty 1

## 2016-06-03 MED ORDER — FENTANYL CITRATE (PF) 100 MCG/2ML IJ SOLN
INTRAMUSCULAR | Status: DC | PRN
  Administered 2016-06-03: 100 ug via INTRAVENOUS

## 2016-06-03 MED ORDER — HYDROMORPHONE HCL 1 MG/ML IJ SOLN
0.2500 mg | INTRAMUSCULAR | Status: DC | PRN
  Administered 2016-06-03: 0.5 mg via INTRAVENOUS

## 2016-06-03 MED ORDER — SODIUM CHLORIDE 0.9 % IV SOLN
INTRAVENOUS | Status: DC | PRN
  Administered 2016-06-03: 08:00:00 via INTRAVENOUS

## 2016-06-03 MED ORDER — ONDANSETRON HCL 4 MG/2ML IJ SOLN: 4.0000 mg | Freq: Once | INTRAMUSCULAR | Status: DC | PRN

## 2016-06-03 MED ORDER — TRAMADOL HCL 50 MG PO TABS
100.0000 mg | ORAL_TABLET | Freq: Four times a day (QID) | ORAL | Status: DC
  Administered 2016-06-03 – 2016-06-07 (×16): 100 mg
  Filled 2016-06-03 (×16): qty 2

## 2016-06-03 MED ORDER — CALCIUM CARBONATE ANTACID 500 MG PO CHEW
400.0000 mg | CHEWABLE_TABLET | Freq: Three times a day (TID) | ORAL | Status: DC
  Administered 2016-06-03 – 2016-06-08 (×16): 400 mg
  Filled 2016-06-03 (×17): qty 2

## 2016-06-03 MED ORDER — DOCUSATE SODIUM 50 MG/5ML PO LIQD
100.0000 mg | Freq: Two times a day (BID) | ORAL | Status: DC
  Administered 2016-06-03 – 2016-06-07 (×6): 100 mg
  Filled 2016-06-03 (×11): qty 10

## 2016-06-03 MED ORDER — BACITRACIN ZINC 500 UNIT/GM EX OINT
TOPICAL_OINTMENT | Freq: Two times a day (BID) | CUTANEOUS | Status: DC
  Administered 2016-06-03 – 2016-06-08 (×10): via TOPICAL
  Filled 2016-06-03 (×2): qty 28.35

## 2016-06-03 MED ORDER — DILTIAZEM 12 MG/ML ORAL SUSPENSION
30.0000 mg | Freq: Four times a day (QID) | ORAL | Status: DC
Start: 2016-06-03 — End: 2016-06-09
  Administered 2016-06-03 – 2016-06-08 (×21): 30 mg
  Filled 2016-06-03 (×25): qty 3

## 2016-06-03 MED ORDER — INSULIN ASPART 100 UNIT/ML ~~LOC~~ SOLN
0.0000 [IU] | SUBCUTANEOUS | Status: DC
  Administered 2016-06-03: 3 [IU] via SUBCUTANEOUS
  Administered 2016-06-03: 4 [IU] via SUBCUTANEOUS
  Administered 2016-06-03 – 2016-06-04 (×2): 3 [IU] via SUBCUTANEOUS
  Administered 2016-06-04 (×2): 4 [IU] via SUBCUTANEOUS
  Administered 2016-06-04 – 2016-06-05 (×3): 3 [IU] via SUBCUTANEOUS
  Administered 2016-06-05: 4 [IU] via SUBCUTANEOUS
  Administered 2016-06-05 – 2016-06-06 (×4): 3 [IU] via SUBCUTANEOUS
  Administered 2016-06-06: 2 [IU] via SUBCUTANEOUS
  Administered 2016-06-07 – 2016-06-08 (×2): 4 [IU] via SUBCUTANEOUS

## 2016-06-03 MED ORDER — SENNOSIDES 8.8 MG/5ML PO SYRP
5.0000 mL | ORAL_SOLUTION | Freq: Two times a day (BID) | ORAL | Status: DC
  Administered 2016-06-03 – 2016-06-07 (×5): 5 mL
  Filled 2016-06-03 (×7): qty 5

## 2016-06-03 MED ORDER — MUPIROCIN 2 % EX OINT
TOPICAL_OINTMENT | Freq: Two times a day (BID) | CUTANEOUS | Status: DC
  Administered 2016-06-03 – 2016-06-05 (×5): via NASAL
  Administered 2016-06-05: 1 via NASAL
  Administered 2016-06-06 – 2016-06-08 (×5): via NASAL
  Filled 2016-06-03 (×2): qty 22

## 2016-06-03 MED ORDER — METHYLPREDNISOLONE SODIUM SUCC 125 MG IJ SOLR
INTRAMUSCULAR | Status: AC
  Filled 2016-06-03: qty 2

## 2016-06-03 MED ORDER — MUPIROCIN 2 % EX OINT
TOPICAL_OINTMENT | CUTANEOUS | Status: AC
  Filled 2016-06-03: qty 22

## 2016-06-03 SURGICAL SUPPLY — 42 items
BANDAGE ACE 4X5 VEL STRL LF (GAUZE/BANDAGES/DRESSINGS) ×3 IMPLANT
BLADE SURG ROTATE 9660 (MISCELLANEOUS) IMPLANT
BNDG GAUZE ELAST 4 BULKY (GAUZE/BANDAGES/DRESSINGS) ×3 IMPLANT
CANISTER SUCTION 2500CC (MISCELLANEOUS) ×3 IMPLANT
CHLORAPREP W/TINT 26ML (MISCELLANEOUS) IMPLANT
COVER SURGICAL LIGHT HANDLE (MISCELLANEOUS) ×3 IMPLANT
DRAPE EXTREMITY T 121X128X90 (DRAPE) ×3 IMPLANT
DRAPE INCISE IOBAN 66X45 STRL (DRAPES) ×3 IMPLANT
DRAPE ORTHO SPLIT 77X108 STRL (DRAPES)
DRAPE SURG ORHT 6 SPLT 77X108 (DRAPES) IMPLANT
DRESSING HYDROCOLLOID 4X4 (GAUZE/BANDAGES/DRESSINGS) ×3 IMPLANT
DRSG ADAPTIC 3X8 NADH LF (GAUZE/BANDAGES/DRESSINGS) IMPLANT
DRSG CUTIMED SORBACT 7X9 (GAUZE/BANDAGES/DRESSINGS) ×6 IMPLANT
DRSG PAD ABDOMINAL 8X10 ST (GAUZE/BANDAGES/DRESSINGS) IMPLANT
DRSG VAC ATS LRG SENSATRAC (GAUZE/BANDAGES/DRESSINGS) IMPLANT
DRSG VAC ATS MED SENSATRAC (GAUZE/BANDAGES/DRESSINGS) ×3 IMPLANT
DRSG VAC ATS SM SENSATRAC (GAUZE/BANDAGES/DRESSINGS) IMPLANT
ELECT REM PT RETURN 9FT ADLT (ELECTROSURGICAL) ×3
ELECTRODE REM PT RTRN 9FT ADLT (ELECTROSURGICAL) ×1 IMPLANT
GAUZE SPONGE 4X4 12PLY STRL (GAUZE/BANDAGES/DRESSINGS) IMPLANT
GEL ULTRASOUND 20GR AQUASONIC (MISCELLANEOUS) IMPLANT
GLOVE BIO SURGEON STRL SZ 6.5 (GLOVE) ×4 IMPLANT
GLOVE BIO SURGEONS STRL SZ 6.5 (GLOVE) ×2
GOWN STRL REUS W/ TWL LRG LVL3 (GOWN DISPOSABLE) ×2 IMPLANT
GOWN STRL REUS W/TWL LRG LVL3 (GOWN DISPOSABLE) ×4
HANDPIECE INTERPULSE COAX TIP (DISPOSABLE)
KIT BASIN OR (CUSTOM PROCEDURE TRAY) ×3 IMPLANT
KIT ROOM TURNOVER OR (KITS) ×3 IMPLANT
MATRIX SURGICAL PSM 7X10CM (Tissue) ×3 IMPLANT
MICROMATRIX 1000MG (Tissue) ×3 IMPLANT
NS IRRIG 1000ML POUR BTL (IV SOLUTION) ×3 IMPLANT
PACK GENERAL/GYN (CUSTOM PROCEDURE TRAY) ×3 IMPLANT
PAD ARMBOARD 7.5X6 YLW CONV (MISCELLANEOUS) ×6 IMPLANT
PAD NEG PRESSURE SENSATRAC (MISCELLANEOUS) ×3 IMPLANT
SET HNDPC FAN SPRY TIP SCT (DISPOSABLE) IMPLANT
SOLUTION PARTIC MCRMTRX 1000MG (Tissue) ×1 IMPLANT
SURGILUBE 2OZ TUBE FLIPTOP (MISCELLANEOUS) ×3 IMPLANT
SUT SILK 4 0 PS 2 (SUTURE) ×3 IMPLANT
SUT VIC AB 5-0 PS2 18 (SUTURE) ×6 IMPLANT
TOWEL OR 17X24 6PK STRL BLUE (TOWEL DISPOSABLE) IMPLANT
TOWEL OR 17X26 10 PK STRL BLUE (TOWEL DISPOSABLE) ×3 IMPLANT
UNDERPAD 30X30 (UNDERPADS AND DIAPERS) ×12 IMPLANT

## 2016-06-03 NOTE — Progress Notes (Signed)
PT Cancellation Note  Patient Details Name: Marie [name] MRN: [id number] DOB: 1941/03/14   Cancelled Treatment:    Reason Eval/Treat Not Completed: Patient at procedure or test/unavailable   Duncan Dull 05/15/2016, [place], PT DPT  [phone]

## 2016-06-03 NOTE — Progress Notes (Signed)
Trauma Service Note  Subjective: Patient just back from surgery.  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  Objective: Vital signs in last 24 hours: Temp:  [97.5 F (36.4 C)-98.5 F (36.9 C)] 97.7 F (36.5 C) (10/21 0930) Pulse Rate:  [72-83] 78 (10/21 0943) Resp:  [16-19] 16 (10/21 0943) BP: (158-185)/(48-62) 167/56 (10/21 0943) SpO2:  [96 %-100 %] 96 % (10/21 0943) Weight:  [92.1 kg (203 lb)-92.5 kg (204 lb)] 92.1 kg (203 lb) (10/21 0330) Last BM Date: 06/02/16  Intake/Output from previous day: 10/20 0701 - 10/21 0700 In: 1733.8 [I.V.:1243.8; NG/GT:390; IV Piggyback:100] Out: 1000 [Urine:1000] Intake/Output this shift: Total I/O In: 700 [I.V.:700] Out: 210 [Urine:200; Blood:10]  General: Sleepy, but arousable.  Lungs:  Clear  Abd: Soft, had been tolerating tube feedings well  Extremities: No changes.  Left upper extremity wound debrided in the OR by Dr. Marla Roe  Neuro: Intact  Lab Results: CBC   Recent Labs  06/02/16 0500 05/19/2016 0456  WBC 64.1* 56.6*  HGB 7.5* 7.4*  HCT 23.4* 23.2*  PLT 339 367   BMET  Recent Labs  06/02/16 0500 06/11/2016 0456  NA 132* 130*  K 4.5 4.5  CL 99* 97*  CO2 27 27  GLUCOSE 135* 127*  BUN 32* 24*  CREATININE 0.98 0.85  CALCIUM 7.4* 7.5*   PT/INR No results for input(s): LABPROT, INR in the last 72 hours. ABG No results for input(s): PHART, HCO3 in the last 72 hours.  Invalid input(s): PCO2, PO2  Studies/Results: Dg Pelvis Comp Min 3v  Result Date: 06/02/2016 CLINICAL DATA:  Left acetabular fracture EXAM: JUDET PELVIS - 3+ VIEW COMPARISON:  CT abdomen and pelvis with bony reformats May 19, 2016 FINDINGS: Frontal as well as bilateral oblique views show a fracture extending from the left mid acetabulum into the medial inferior left iliac crest. There is displacement of fracture fragments by as much as 6 mm along the superior aspect of this fracture. There is only slight separation of fracture  fragments at the left acetabulum. No fractures are identified elsewhere. No dislocation. There is mild symmetric narrowing of both hip joints. There is degenerative change in the pubic symphysis There is a fracture through the mid superior pubic ramus on the left with slight separation of fracture fragments and a slightly displaced fracture of the mid left ischium. There is mild narrowing of both hip joints. There is degenerative change in the pubic symphysis. There is a right femoral catheter with the tip at the mid sacral level, likely in the right external iliac vein. IMPRESSION: Fracture through the left acetabulum extending into the inferomedial aspect of the left iliac bone with separation of fracture fragments more notably along the more superior aspect of this fracture. There also fractures of the mid left superior pubic ramus and mid left ischium. No dislocations. Mild symmetric narrowing both hip joints. 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 Electronically Signed   By: Lowella Grip III M.D.   On: 06/02/2016 14:07   Dg Abd Portable 1v  Result Date: 06/02/2016 CLINICAL DATA:  Ob syndrome.  Abdominal distention EXAM: PORTABLE ABDOMEN - 1 VIEW COMPARISON:  June 01, 2016 FINDINGS: Enteric tube tip is in the distal stomach, unchanged. There remains generalized colonic dilatation without focal area of obstruction. Small bowel does not appear significantly distended. No free air evident. There is a right femoral catheter with the tip overlying the mid right sacrum, likely in the right external iliac artery. There are phleboliths in  the pelvis. IMPRESSION: Persistent colonic dilatation without appreciable change from 1 day prior. Enteric tube position unchanged. No free air. No progression of bowel dilatation. There appears to be marginally less dilatation overall compared to 1 day prior. Electronically Signed   By: Lowella Grip III M.D.   On: 06/02/2016 08:43     Anti-infectives: Anti-infectives    Start     Dose/Rate Route Frequency Ordered Stop   05/20/2016 0821  polymyxin B 500,000 Units, bacitracin 50,000 Units in sodium chloride irrigation 0.9 % 500 mL irrigation  Status:  Discontinued       As needed 05/14/2016 0821 05/18/2016 0857   05/17/2016 1400  ceFAZolin (ANCEF) IVPB 2g/100 mL premix  Status:  Discontinued     2 g 200 mL/hr over 30 Minutes Intravenous To Short Stay 05/21/2016 0538 06/13/2016 1513   06/07/2016 1000  valACYclovir (VALTREX) tablet 1,000 mg     1,000 mg Oral 2 times daily 05/31/2016 0934 06/11/16 0959   05/14/2016 0600  ceFAZolin (ANCEF) IVPB 2g/100 mL premix  Status:  Discontinued     2 g 200 mL/hr over 30 Minutes Intravenous On call to O.R. 05/31/16 1948 05/14/2016 0538   05/26/16 1800  ceFAZolin (ANCEF) IVPB 2g/100 mL premix     2 g 200 mL/hr over 30 Minutes Intravenous Every 8 hours 05/26/16 1417     05/24/16 0800  vancomycin (VANCOCIN) IVPB 1000 mg/200 mL premix  Status:  Discontinued     1,000 mg 200 mL/hr over 60 Minutes Intravenous Every 12 hours 05/23/16 1919 05/24/16 0936   05/24/16 0200  piperacillin-tazobactam (ZOSYN) IVPB 3.375 g  Status:  Discontinued     3.375 g 12.5 mL/hr over 240 Minutes Intravenous Every 8 hours 05/23/16 1919 05/26/16 1417   05/23/16 2000  vancomycin (VANCOCIN) 1,500 mg in sodium chloride 0.9 % 500 mL IVPB     1,500 mg 250 mL/hr over 120 Minutes Intravenous  Once 05/23/16 1919 05/23/16 2229   05/23/16 2000  piperacillin-tazobactam (ZOSYN) IVPB 3.375 g     3.375 g 100 mL/hr over 30 Minutes Intravenous  Once 05/23/16 1919 05/23/16 2102      Assessment/Plan: s/p Procedure(s): IRRIGATION AND DEBRIDEMENT OF LEFT FOREARM WITH ACELL PLACEMENT. Restart tube feedings.  No other big changes  LOS: 15 days   Kathryne Eriksson. Dahlia Bailiff, MD, FACS [phone] Trauma Surgeon 05/14/2016
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
## 2016-06-03 NOTE — Anesthesia Preprocedure Evaluation (Addendum)
Anesthesia Evaluation  Patient identified by MRN, date of birth, ID band Patient awake    Reviewed: Allergy & Precautions, NPO status , Patient's Chart, lab work & pertinent test results  Airway Mallampati: I  TM Distance: >3 FB Neck ROM: Full    Dental   Pulmonary    Pulmonary exam normal        Cardiovascular Normal cardiovascular exam     Neuro/Psych Appartently neck cleared by Dr Ronnald Ramp. Still in C collar    GI/Hepatic   Endo/Other    Renal/GU      Musculoskeletal   Abdominal   Peds  Hematology   Anesthesia Other Findings   Reproductive/Obstetrics                            Anesthesia Physical Anesthesia Plan  ASA: III  Anesthesia Plan: General   Post-op Pain Management:    Induction: Intravenous  Airway Management Planned: LMA  Additional Equipment:   Intra-op Plan:   Post-operative Plan: Extubation in OR  Informed Consent: I have reviewed the patients History and Physical, chart, labs and discussed the procedure including the risks, benefits and alternatives for the proposed anesthesia with the patient or authorized representative who has indicated his/her understanding and acceptance.     Plan Discussed with: CRNA and Surgeon  Anesthesia Plan Comments:         Anesthesia Quick Evaluation

## 2016-06-03 NOTE — Brief Op Note (Signed)
05/25/2016 - 06/05/2016  7:42 AM  PATIENT:  Rubie Maid Lindo  75 y.o. female  PRE-OPERATIVE DIAGNOSIS:  LEFT ARM WOUND  POST-OPERATIVE DIAGNOSIS:  LEFT ARM WOUND  PROCEDURE:  Procedure(s): IRRIGATION AND DEBRIDEMENT OF LEFT FOREARM WITH ACELL PLACEMENT. (Left)  SURGEON:  Surgeon(s) and Role:    * Claire S Dillingham, DO - Primary  PHYSICIAN ASSISTANT:   ASSISTANTS: none   ANESTHESIA:   general  EBL:  No intake/output data recorded.  BLOOD ADMINISTERED:none  DRAINS: none   LOCAL MEDICATIONS USED:  NONE  SPECIMEN:  Left arm  DISPOSITION OF SPECIMEN:  To lab  COUNTS:  micro  TOURNIQUET:  * No tourniquets in log *  DICTATION: .Dragon Dictation  PLAN OF CARE: Admit to inpatient   PATIENT DISPOSITION:  PACU - hemodynamically stable.   Delay start of Pharmacological VTE agent (>24hrs) due to surgical blood loss or risk of bleeding: no

## 2016-06-03 NOTE — Anesthesia Postprocedure Evaluation (Signed)
Anesthesia Post Note  Patient: Marie [name]  Procedure(s) Performed: Procedure(s) (LRB): IRRIGATION AND DEBRIDEMENT OF LEFT FOREARM WITH ACELL PLACEMENT. (Left)  Patient location during evaluation: PACU Anesthesia Type: General Level of consciousness: awake and alert Pain management: pain level controlled Vital Signs Assessment: post-procedure vital signs reviewed and stable Respiratory status: spontaneous breathing, nonlabored ventilation, respiratory function stable and patient connected to nasal cannula oxygen Cardiovascular status: blood pressure returned to baseline and stable Postop Assessment: no signs of nausea or vomiting Anesthetic complications: no    Last Vitals:  Vitals:   05/25/2016 0930 05/24/2016 0943  BP: (!) 173/56 (!) 167/56  Pulse:  78  Resp:  16  Temp: 36.5 C     Last Pain:  Vitals:   06/11/2016 0945  TempSrc:   PainSc: 2                  Liba Hulsey DAVID

## 2016-06-03 NOTE — Progress Notes (Signed)
Patient ID: Marie [name], female   DOB: 1941/06/01, 75 y.o.   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 I dont see this in op report.  She has some facial and lip swelling with her reporting throat is hurting also. She does not have any stridor, she is breathing fine with good saturation and resp rate.  I think some of this is postop from surgery and from an ett.  I will give her benadryl now.  She apparently has cp with steroids so I will hold on this and epi as well.

## 2016-06-03 NOTE — Progress Notes (Signed)
Patient has had increased swelling of lips and face as the day has progressed. Patient reported that her throat feels tight. Pt had already been having increased drooling. Suctions is set up at the bedside. Dr. Donne Hazel was called, he was off site so also notified elink. Benadryl was given per one time order. Will continue to monitor airway.

## 2016-06-03 NOTE — Anesthesia Procedure Notes (Signed)
Procedure Name: Intubation Date/Time: 06/09/2016 7:51 AM Performed by: Marinda Elk A Pre-anesthesia Checklist: Patient identified, Emergency Drugs available, Suction available, Patient being monitored and Timeout performed Patient Re-evaluated:Patient Re-evaluated prior to inductionOxygen Delivery Method: Circle System Utilized and Circle system utilized Preoxygenation: Pre-oxygenation with 100% oxygen Intubation Type: IV induction Ventilation: Mask ventilation without difficulty Laryngoscope Size: Glidescope Grade View: Grade I Tube type: Oral Tube size: 7.0 mm Number of attempts: 1 Airway Equipment and Method: Stylet Placement Confirmation: ETT inserted through vocal cords under direct vision,  positive ETCO2 and breath sounds checked- equal and bilateral Secured at: 22 cm Tube secured with: Tape Dental Injury: Teeth and Oropharynx as per pre-operative assessment  Comments: Front of c-collar removed, head/neck stabilized during laryngoscopy, then c-collar replaced

## 2016-06-03 NOTE — Interval H&P Note (Signed)
History and Physical Interval Note:  05/14/2016 7:08 AM  Marie [name]  has presented today for surgery, with the diagnosis of LEFT ARM WOUND  The various methods of treatment have been discussed with the patient and family. After consideration of risks, benefits and other options for treatment, the patient has consented to  Procedure(s): IRRIGATION AND DEBRIDEMENT OF LEFT FOREARM WITH [place]. (Left) as a surgical intervention .  The patient's history has been reviewed, patient examined, no change in status, stable for surgery.  I have reviewed the patient's chart and labs.  Questions were answered to the patient's satisfaction.     Wallace Going
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
## 2016-06-03 NOTE — Transfer of Care (Signed)
Immediate Anesthesia Transfer of Care Note  Patient: Marie [name]  Procedure(s) Performed: Procedure(s): IRRIGATION AND DEBRIDEMENT OF LEFT FOREARM WITH ACELL PLACEMENT. (Left)  Patient Location: PACU  Anesthesia Type:General  Level of Consciousness: awake  Airway & Oxygen Therapy: Patient Spontanous Breathing and Patient connected to nasal cannula oxygen  Post-op Assessment: Report given to RN and Post -op Vital signs reviewed and stable  Post vital signs: Reviewed and stable  Last Vitals:  Vitals:   06/02/16 2313 06/07/2016 0330  BP: (!) 179/54 (!) 172/60  Pulse: 83 77  Resp: 18 19  Temp: 36.9 C 36.6 C    Last Pain:  Vitals:   06/12/2016 0330  TempSrc: Axillary  PainSc: Asleep      Patients Stated Pain Goal: 3 ([id number])  Complications: No apparent anesthesia complications

## 2016-06-04 ENCOUNTER — Encounter (HOSPITAL_COMMUNITY): Payer: Self-pay | Admitting: Plastic Surgery

## 2016-06-04 LAB — GLUCOSE, CAPILLARY
GLUCOSE-CAPILLARY: 146 mg/dL — AB (ref 65–99)
GLUCOSE-CAPILLARY: 125 mg/dL — AB (ref 65–99)
GLUCOSE-CAPILLARY: 124 mg/dL — AB (ref 65–99)
Glucose-Capillary: 102 mg/dL — ABNORMAL HIGH (ref 65–99)
Glucose-Capillary: 151 mg/dL — ABNORMAL HIGH (ref 65–99)
Glucose-Capillary: 156 mg/dL — ABNORMAL HIGH (ref 65–99)
Glucose-Capillary: 169 mg/dL — ABNORMAL HIGH (ref 65–99)

## 2016-06-04 LAB — CBC
HEMATOCRIT: 23.2 % — AB (ref 36.0–46.0)
HEMOGLOBIN: 7.4 g/dL — AB (ref 12.0–15.0)
MCH: 30.5 pg (ref 26.0–34.0)
MCHC: 31.9 g/dL (ref 30.0–36.0)
MCV: 95.5 fL (ref 78.0–100.0)
Platelets: 367 10*3/uL (ref 150–400)
RBC: 2.43 MIL/uL — AB (ref 3.87–5.11)
RDW: 19.4 % — ABNORMAL HIGH (ref 11.5–15.5)
WBC: 56.6 10*3/uL (ref 4.0–10.5)

## 2016-06-04 MED ORDER — MAGIC MOUTHWASH W/LIDOCAINE
5.0000 mL | Freq: Three times a day (TID) | ORAL | Status: DC
  Administered 2016-06-04 – 2016-06-08 (×14): 5 mL via ORAL
  Filled 2016-06-04 (×15): qty 5

## 2016-06-04 NOTE — Progress Notes (Signed)
Trauma Service Note  Subjective: Patient very responsive and appropriate, alert and responsive.  No acute distress  Objective: Vital signs in last 24 hours: Temp:  [97.4 F (36.3 C)-98.5 F (36.9 C)] 97.4 F (36.3 C) (10/22 0641) Pulse Rate:  [76-83] 82 (10/22 0930) Resp:  [13-17] 15 (10/22 0900) BP: (152-183)/(44-59) 160/45 (10/22 0930) SpO2:  [94 %-97 %] 97 % (10/22 0900) Weight:  [94.3 kg (208 lb)] 94.3 kg (208 lb) (10/22 0337) Last BM Date: 05/29/2016  Intake/Output from previous day: 10/21 0701 - 10/22 0700 In: 2525.8 [I.V.:1581.7; NG/GT:544.2; IV Piggyback:400] Out: 1735 [Urine:1625; Drains:100; Blood:10] Intake/Output this shift: No intake/output data recorded.  General: No distress.  Tilted in the bed.  Talking very clearly  Lungs: Clear to auscultation.  Sats are good..  Abd: Distended, mildly tender along waist line according to the patient.  Good bowel sounds.  Extremities: No changes  Neuro: Intact  ENT:  Lips ulcerated and sore.  Very dry.  Will try some magic mouthwash for oral care  Lab Results: CBC   Recent Labs  06/02/16 0500 05/27/2016 0456  WBC 64.1* 56.6*  HGB 7.5* 7.4*  HCT 23.4* 23.2*  PLT 339 367   BMET  Recent Labs  06/02/16 0500 05/15/2016 0456  NA 132* 130*  K 4.5 4.5  CL 99* 97*  CO2 27 27  GLUCOSE 135* 127*  BUN 32* 24*  CREATININE 0.98 0.85  CALCIUM 7.4* 7.5*   PT/INR No results for input(s): LABPROT, INR in the last 72 hours. ABG No results for input(s): PHART, HCO3 in the last 72 hours.  Invalid input(s): PCO2, PO2  Studies/Results: Dg Pelvis Comp Min 3v  Result Date: 06/02/2016 CLINICAL DATA:  Left acetabular fracture EXAM: JUDET PELVIS - 3+ VIEW COMPARISON:  CT abdomen and pelvis with bony reformats May 19, 2016 FINDINGS: Frontal as well as bilateral oblique views show a fracture extending from the left mid acetabulum into the medial inferior left iliac crest. There is displacement of fracture fragments by as  much as 6 mm along the superior aspect of this fracture. There is only slight separation of fracture fragments at the left acetabulum. No fractures are identified elsewhere. No dislocation. There is mild symmetric narrowing of both hip joints. There is degenerative change in the pubic symphysis There is a fracture through the mid superior pubic ramus on the left with slight separation of fracture fragments and a slightly displaced fracture of the mid left ischium. There is mild narrowing of both hip joints. There is degenerative change in the pubic symphysis. There is a right femoral catheter with the tip at the mid sacral level, likely in the right external iliac vein. IMPRESSION: Fracture through the left acetabulum extending into the inferomedial aspect of the left iliac bone with separation of fracture fragments more notably along the more superior aspect of this fracture. There also fractures of the mid left superior pubic ramus and mid left ischium. No dislocations. Mild symmetric narrowing both hip joints. 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 Electronically Signed   By: Lowella Grip III M.D.   On: 06/02/2016 14:07    Anti-infectives: Anti-infectives    Start     Dose/Rate Route Frequency Ordered Stop   05/31/2016 2200  valACYclovir (VALTREX) tablet 1,000 mg     1,000 mg Per Tube 2 times daily 06/09/2016 1129 06/11/16 0959   05/20/2016 0821  polymyxin B 500,000 Units, bacitracin 50,000 Units in sodium chloride irrigation 0.9 % 500 mL irrigation  Status:  Discontinued       As needed 06/01/2016 0821 05/31/2016 0857   05/31/2016 1400  ceFAZolin (ANCEF) IVPB 2g/100 mL premix  Status:  Discontinued     2 g 200 mL/hr over 30 Minutes Intravenous To Short Stay 05/15/2016 0538 05/28/2016 1513   06/11/2016 1000  valACYclovir (VALTREX) tablet 1,000 mg  Status:  Discontinued     1,000 mg Oral 2 times daily 05/24/2016 0934 05/16/2016 1130   05/20/2016 0600  ceFAZolin (ANCEF) IVPB 2g/100 mL premix  Status:   Discontinued     2 g 200 mL/hr over 30 Minutes Intravenous On call to O.R. 05/31/16 1948 06/12/2016 0538   05/26/16 1800  ceFAZolin (ANCEF) IVPB 2g/100 mL premix     2 g 200 mL/hr over 30 Minutes Intravenous Every 8 hours 05/26/16 1417     05/24/16 0800  vancomycin (VANCOCIN) IVPB 1000 mg/200 mL premix  Status:  Discontinued     1,000 mg 200 mL/hr over 60 Minutes Intravenous Every 12 hours 05/23/16 1919 05/24/16 0936   05/24/16 0200  piperacillin-tazobactam (ZOSYN) IVPB 3.375 g  Status:  Discontinued     3.375 g 12.5 mL/hr over 240 Minutes Intravenous Every 8 hours 05/23/16 1919 05/26/16 1417   05/23/16 2000  vancomycin (VANCOCIN) 1,500 mg in sodium chloride 0.9 % 500 mL IVPB     1,500 mg 250 mL/hr over 120 Minutes Intravenous  Once 05/23/16 1919 05/23/16 2229   05/23/16 2000  piperacillin-tazobactam (ZOSYN) IVPB 3.375 g     3.375 g 100 mL/hr over 30 Minutes Intravenous  Once 05/23/16 1919 05/23/16 2102      Assessment/Plan: s/p Procedure(s): IRRIGATION AND DEBRIDEMENT OF LEFT FOREARM WITH ACELL PLACEMENT. Advance diet Sips of clear liquids  Magic mouthwash PT for mobilization.  LOS: 16 days   Kathryne Eriksson. Dahlia Bailiff, MD, FACS [phone] Trauma Surgeon 06/04/2016

## 2016-06-05 LAB — GLUCOSE, CAPILLARY
GLUCOSE-CAPILLARY: 79 mg/dL (ref 65–99)
GLUCOSE-CAPILLARY: 142 mg/dL — AB (ref 65–99)
Glucose-Capillary: 139 mg/dL — ABNORMAL HIGH (ref 65–99)
Glucose-Capillary: 81 mg/dL (ref 65–99)
Glucose-Capillary: 91 mg/dL (ref 65–99)
Glucose-Capillary: 141 mg/dL — ABNORMAL HIGH (ref 65–99)

## 2016-06-05 MED ORDER — LEVOTHYROXINE SODIUM 88 MCG PO TABS: 88.0000 ug | ORAL_TABLET | Freq: Every day | ORAL | Status: DC

## 2016-06-05 MED ORDER — SERTRALINE HCL 100 MG PO TABS: 100.0000 mg | ORAL_TABLET | Freq: Every day | ORAL | Status: DC

## 2016-06-05 MED ORDER — RIVAROXABAN 20 MG PO TABS: 20.0000 mg | ORAL_TABLET | Freq: Every day | ORAL | Status: DC
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
## 2016-06-05 NOTE — Discharge Instructions (Addendum)
Information on my medicine - XARELTO (rivaroxaban)  This medication education was reviewed with me or my healthcare representative as part of my discharge preparation.  The pharmacist that spoke with me during my hospital stay was:  Kenney Houseman, [place]? Xarelto was prescribed to treat blood clots that may have been found in the veins of your legs (deep vein thrombosis) or in your lungs (pulmonary embolism) and to reduce the risk of them occurring again.  What do you need to know about Xarelto? The starting dose is one 15 mg tablet taken TWICE daily with food for the FIRST 21 DAYS then on 06-20-2016 the dose is changed to one 20 mg tablet taken ONCE A DAY with your evening meal.  DO NOT stop taking Xarelto without talking to the health care provider who prescribed the medication.  Refill your prescription for 20 mg tablets before you run out.  After discharge, you should have regular check-up appointments with your healthcare provider that is prescribing your Xarelto.  In the future your dose may need to be changed if your kidney function changes by a significant amount.  What do you do if you miss a dose? If you are taking Xarelto TWICE DAILY and you miss a dose, take it as soon as you remember. You may take two 15 mg tablets (total 30 mg) at the same time then resume your regularly scheduled 15 mg twice daily the next day.  If you are taking Xarelto ONCE DAILY and you miss a dose, take it as soon as you remember on the same day then continue your regularly scheduled once daily regimen the next day. Do not take two doses of Xarelto at the same time.   Important Safety Information Xarelto is a blood thinner medicine that can cause bleeding. You should call your healthcare provider right away if you experience any of the following: ? Bleeding from an injury or your nose that does not stop. ? Unusual colored urine (red or dark brown) or unusual colored stools  (red or black). ? Unusual bruising for unknown reasons. ? A serious fall or if you hit your head (even if there is no bleeding).  Some medicines may interact with Xarelto and might increase your risk of bleeding while on Xarelto. To help avoid this, consult your healthcare provider or pharmacist prior to using any new prescription or non-prescription medications, including herbals, vitamins, non-steroidal anti-inflammatory drugs (NSAIDs) and supplements.  This website has more information on Xarelto: [email or web address]
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
## 2016-06-05 NOTE — Progress Notes (Signed)
2 Days Post-Op  Subjective: Patient seen for post operative follow up following debridement of the left arm wound which was down to muscle.  Acell and VAC dressing were placed at the time of surgery.  The VAC dressing remains in place and functioning well.  She will need a VAC dressing change by this Friday, Oct. 27th and every Mon-Wed-Friday thereafter.   Objective: Vital signs in last 24 hours: Temp:  [98.1 F (36.7 C)-98.7 F (37.1 C)] 98.4 F (36.9 C) (10/23 1500) Pulse Rate:  [73-82] 82 (10/23 1157) Resp:  [16-23] 20 (10/23 1057) BP: (172-188)/(46-72) 185/57 (10/23 1500) SpO2:  [94 %-98 %] 96 % (10/23 1057) Last BM Date: 06/05/16  Intake/Output from previous day: 10/22 0701 - 10/23 0700 In: 1960.2 [I.V.:250; NG/GT:1510.2; IV Piggyback:200] Out: [id number] [Urine:1550; Drains:25] Intake/Output this shift: Total I/O In: -  Out: 850 [Urine:850]  General appearance: alert, cooperative and mild distress  Lab Results:   Recent Labs  05/19/2016 0456  WBC 56.6*  HGB 7.4*  HCT 23.2*  PLT 367   BMET  Recent Labs  05/22/2016 0456  NA 130*  K 4.5  CL 97*  CO2 27  GLUCOSE 127*  BUN 24*  CREATININE 0.85  CALCIUM 7.5*   PT/INR No results for input(s): LABPROT, INR in the last 72 hours. ABG No results for input(s): PHART, HCO3 in the last 72 hours.  Invalid input(s): PCO2, PO2  Studies/Results: No results found.  Anti-infectives: Anti-infectives    Start     Dose/Rate Route Frequency Ordered Stop   05/21/2016 2200  valACYclovir (VALTREX) tablet 1,000 mg     1,000 mg Per Tube 2 times daily 05/14/2016 1129 06/11/16 0959   06/04/2016 0821  polymyxin B 500,000 Units, bacitracin 50,000 Units in sodium chloride irrigation 0.9 % 500 mL irrigation  Status:  Discontinued       As needed 06/05/2016 0821 05/22/2016 0857   05/30/2016 1400  ceFAZolin (ANCEF) IVPB 2g/100 mL premix  Status:  Discontinued     2 g 200 mL/hr over 30 Minutes Intravenous To Short Stay 05/25/2016 0538 05/21/2016 1513    05/27/2016 1000  valACYclovir (VALTREX) tablet 1,000 mg  Status:  Discontinued     1,000 mg Oral 2 times daily 05/31/2016 0934 05/24/2016 1130   05/28/2016 0600  ceFAZolin (ANCEF) IVPB 2g/100 mL premix  Status:  Discontinued     2 g 200 mL/hr over 30 Minutes Intravenous On call to O.R. 05/31/16 1948 06/09/2016 0538   05/26/16 1800  ceFAZolin (ANCEF) IVPB 2g/100 mL premix  Status:  Discontinued     2 g 200 mL/hr over 30 Minutes Intravenous Every 8 hours 05/26/16 1417 06/05/16 0903   05/24/16 0800  vancomycin (VANCOCIN) IVPB 1000 mg/200 mL premix  Status:  Discontinued     1,000 mg 200 mL/hr over 60 Minutes Intravenous Every 12 hours 05/23/16 1919 05/24/16 0936   05/24/16 0200  piperacillin-tazobactam (ZOSYN) IVPB 3.375 g  Status:  Discontinued     3.375 g 12.5 mL/hr over 240 Minutes Intravenous Every 8 hours 05/23/16 1919 05/26/16 1417   05/23/16 2000  vancomycin (VANCOCIN) 1,500 mg in sodium chloride 0.9 % 500 mL IVPB     1,500 mg 250 mL/hr over 120 Minutes Intravenous  Once 05/23/16 1919 05/23/16 2229   05/23/16 2000  piperacillin-tazobactam (ZOSYN) IVPB 3.375 g     3.375 g 100 mL/hr over 30 Minutes Intravenous  Once 05/23/16 1919 05/23/16 2102      Assessment/Plan: s/p Procedure(s): IRRIGATION AND DEBRIDEMENT  OF LEFT FOREARM WITH ACELL PLACEMENT. (Left) Will start VAC dressing changes every Mon-Wed-Friday starting 06/09/16.   She may need to return to the OR for more Acell vs graft at some point over the next several weeks.  Will follow up next Monday to reassess if has not been discharged.   LOS: 17 days    Travis Purk,PA-C Plastic Surgery [phone]

## 2016-06-06 LAB — BASIC METABOLIC PANEL
ANION GAP: 5 (ref 5–15)
BUN: 21 mg/dL — AB (ref 6–20)
CALCIUM: 7.9 mg/dL — AB (ref 8.9–10.3)
CO2: 28 mmol/L (ref 22–32)
CREATININE: 0.76 mg/dL (ref 0.44–1.00)
Chloride: 96 mmol/L — ABNORMAL LOW (ref 101–111)
GFR calc Af Amer: >60 mL/min (ref >60)
GLUCOSE: 159 mg/dL — AB (ref 65–99)
Potassium: 5.1 mmol/L (ref 3.5–5.1)
Sodium: 129 mmol/L — ABNORMAL LOW (ref 135–145)

## 2016-06-06 LAB — GLUCOSE, CAPILLARY
GLUCOSE-CAPILLARY: 123 mg/dL — AB (ref 65–99)
GLUCOSE-CAPILLARY: 39 mg/dL — AB (ref 65–99)
GLUCOSE-CAPILLARY: 69 mg/dL (ref 65–99)
Glucose-Capillary: 109 mg/dL — ABNORMAL HIGH (ref 65–99)
Glucose-Capillary: 136 mg/dL — ABNORMAL HIGH (ref 65–99)
Glucose-Capillary: 128 mg/dL — ABNORMAL HIGH (ref 65–99)
Glucose-Capillary: 103 mg/dL — ABNORMAL HIGH (ref 65–99)
Glucose-Capillary: 43 mg/dL — CL (ref 65–99)

## 2016-06-06 LAB — CBC
HCT: 23.7 % — ABNORMAL LOW (ref 36.0–46.0)
HEMOGLOBIN: 7.4 g/dL — AB (ref 12.0–15.0)
MCH: 30.5 pg (ref 26.0–34.0)
MCHC: 31.2 g/dL (ref 30.0–36.0)
MCV: 97.5 fL (ref 78.0–100.0)
PLATELETS: 461 10*3/uL — AB (ref 150–400)
RBC: 2.43 MIL/uL — ABNORMAL LOW (ref 3.87–5.11)
RDW: 17.8 % — AB (ref 11.5–15.5)
WBC: 58.1 10*3/uL (ref 4.0–10.5)

## 2016-06-06 MED ORDER — HYDRALAZINE HCL 20 MG/ML IJ SOLN
20.0000 mg | Freq: Once | INTRAMUSCULAR | Status: AC
  Administered 2016-06-06: 20 mg via INTRAVENOUS
  Filled 2016-06-06: qty 1

## 2016-06-06 MED ORDER — SODIUM CHLORIDE 0.9% FLUSH
10.0000 mL | INTRAVENOUS | Status: DC | PRN
  Administered 2016-06-06: 10 mL
  Filled 2016-06-06: qty 40

## 2016-06-06 MED ORDER — ALTEPLASE 2 MG IJ SOLR
2.0000 mg | Freq: Once | INTRAMUSCULAR | Status: AC
  Administered 2016-06-06: 2 mg

## 2016-06-06 MED ORDER — METOPROLOL TARTRATE 25 MG/10 ML ORAL SUSPENSION
25.0000 mg | Freq: Two times a day (BID) | ORAL | Status: DC
  Administered 2016-06-06 – 2016-06-08 (×4): 25 mg via ORAL
  Filled 2016-06-06 (×4): qty 10

## 2016-06-06 MED ORDER — LORAZEPAM 2 MG/ML IJ SOLN
0.5000 mg | INTRAMUSCULAR | Status: DC | PRN
  Administered 2016-06-08 (×3): 0.5 mg via INTRAVENOUS
  Filled 2016-06-06 (×3): qty 1

## 2016-06-06 NOTE — Care Management Important Message (Signed)
Important Message  Patient Details  Name: Marie [name] MRN: [id number] Date of Birth: 1941/07/02   Medicare Important Message Given:  Yes    Roselyne Stalnaker 06/06/2016, 1:15 PM

## 2016-06-06 NOTE — Progress Notes (Signed)
MD Grandville Silos notified of pt hallucinating and having a poor food intake.  MD order to leave NG tube until 10/25 after Speech Therapy and Nutrition revisits patient.  Plan to continue calorie count today.  Tube feed stopped earlier in the day (10/24)

## 2016-06-06 NOTE — Progress Notes (Signed)
Patients BP elevated to 193/51, and as high as 203/62 even after giving PRN metoprolol and hydralazine. MD notified and ordered 1x dose of 20mg  hydralazine. RN will administer medication and continue to monitor patient and VS.
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
## 2016-06-06 NOTE — Progress Notes (Signed)
Report received by Burman Nieves, RN from [facility].
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
## 2016-06-06 NOTE — Consult Note (Addendum)
WOC spoke with PA Neta Mends to clarify orders for [facility] dressing change, to begin Friday and then M/W/F thereafter.  The dressing change this week on Friday will be the first postoperative dressing and therefore will require the surgeon or the surgeon's midlevel provider to be at the bedside. Raquel Sarna will remove dressing today instead and I have ordered supplies to the bedside for the bedside nurse should they need to replace it.  Raquel Sarna is aware a bedside nurse can assist if needed.  [place] team will keep patient on our list for follow up should assistance be needed with any dressings after this first post dressing change.  I have gone by the unit and discussed this with the primary RN today as well.  1 VAC Medium sponge, oil emulsion dressing (Adaptic), and VAC canister ordered for plastic surgeon's PA.  Dakotah Orrego Kellogg, [place]  [place], [place]

## 2016-06-06 NOTE — Progress Notes (Signed)
3 Days Post-Op  Subjective: Patient seen for initial VAC dressing change.   The VAC dressing was removed and sorbact dressing left in place over wound bed. Sorbact is loosely sutured in place.  Acell is present and beginning to incorporate into the wound bed. The VAC dressing was reapplied over the sorbact dressing and therapy resumed.  Objective: Vital signs in last 24 hours: Temp:  [97.5 F (36.4 C)-98.5 F (36.9 C)] 97.5 F (36.4 C) (10/24 1851) Pulse Rate:  [71-84] 76 (10/24 1404) Resp:  [17-26] 20 (10/24 1404) BP: (167-203)/(50-66) 188/50 (10/24 1851) SpO2:  [95 %-99 %] 99 % (10/24 1851) Weight:  [96.6 kg (213 lb)] 96.6 kg (213 lb) (10/24 0400) Last BM Date: 06/05/16  Intake/Output from previous day: 10/23 0701 - 10/24 0700 In: 1825 [I.V.:230; NG/GT:1595] Out: 2050 [Urine:2000; Drains:50] Intake/Output this shift: No intake/output data recorded.  General appearance: cooperative, no distress, slowed mentation and confused VAC dressing removed from the left forearm/elbow area and Acell present in wound bed and partially incorporated. the sorbact was left in place and VAC dressing reapplied.   Lab Results:   Recent Labs  06/06/16 1020  WBC 58.1*  HGB 7.4*  HCT 23.7*  PLT 461*   BMET  Recent Labs  06/06/16 1020  NA 129*  K 5.1  CL 96*  CO2 28  GLUCOSE 159*  BUN 21*  CREATININE 0.76  CALCIUM 7.9*   PT/INR No results for input(s): LABPROT, INR in the last 72 hours. ABG No results for input(s): PHART, HCO3 in the last 72 hours.  Invalid input(s): PCO2, PO2  Studies/Results: No results found.  Anti-infectives: Anti-infectives    Start     Dose/Rate Route Frequency Ordered Stop   05/20/2016 2200  valACYclovir (VALTREX) tablet 1,000 mg     1,000 mg Per Tube 2 times daily 05/24/2016 1129 06/11/16 0959   06/13/2016 0821  polymyxin B 500,000 Units, bacitracin 50,000 Units in sodium chloride irrigation 0.9 % 500 mL irrigation  Status:  Discontinued       As  needed 06/02/2016 0821 06/02/2016 0857   06/06/2016 1400  ceFAZolin (ANCEF) IVPB 2g/100 mL premix  Status:  Discontinued     2 g 200 mL/hr over 30 Minutes Intravenous To Short Stay 05/21/2016 0538 05/31/2016 1513   06/06/2016 1000  valACYclovir (VALTREX) tablet 1,000 mg  Status:  Discontinued     1,000 mg Oral 2 times daily 06/04/2016 0934 05/19/2016 1130   06/05/2016 0600  ceFAZolin (ANCEF) IVPB 2g/100 mL premix  Status:  Discontinued     2 g 200 mL/hr over 30 Minutes Intravenous On call to O.R. 05/31/16 1948 05/20/2016 0538   05/26/16 1800  ceFAZolin (ANCEF) IVPB 2g/100 mL premix  Status:  Discontinued     2 g 200 mL/hr over 30 Minutes Intravenous Every 8 hours 05/26/16 1417 06/05/16 0903   05/24/16 0800  vancomycin (VANCOCIN) IVPB 1000 mg/200 mL premix  Status:  Discontinued     1,000 mg 200 mL/hr over 60 Minutes Intravenous Every 12 hours 05/23/16 1919 05/24/16 0936   05/24/16 0200  piperacillin-tazobactam (ZOSYN) IVPB 3.375 g  Status:  Discontinued     3.375 g 12.5 mL/hr over 240 Minutes Intravenous Every 8 hours 05/23/16 1919 05/26/16 1417   05/23/16 2000  vancomycin (VANCOCIN) 1,500 mg in sodium chloride 0.9 % 500 mL IVPB     1,500 mg 250 mL/hr over 120 Minutes Intravenous  Once 05/23/16 1919 05/23/16 2229   05/23/16 2000  piperacillin-tazobactam (ZOSYN) IVPB 3.375  g     3.375 g 100 mL/hr over 30 Minutes Intravenous  Once 05/23/16 1919 05/23/16 2102      Assessment/Plan: s/p Procedure(s): IRRIGATION AND DEBRIDEMENT OF LEFT FOREARM WITH ACELL PLACEMENT. (Left) Acell appears to be incorporating well.   Continue VAC therapy.  Should be ready for VAC dressing change again starting Friday.  Will need to conitnue VAC therapy if discharged to SNF with dressing changes every Mon-Wed-Friday.   LOS: 18 days    Mai Longnecker,PA-C Plastic Surgery [phone]

## 2016-06-07 ENCOUNTER — Inpatient Hospital Stay (HOSPITAL_COMMUNITY): Payer: Medicare Other

## 2016-06-07 LAB — GLUCOSE, CAPILLARY
GLUCOSE-CAPILLARY: 59 mg/dL — AB (ref 65–99)
GLUCOSE-CAPILLARY: 68 mg/dL (ref 65–99)
GLUCOSE-CAPILLARY: 79 mg/dL (ref 65–99)
GLUCOSE-CAPILLARY: 66 mg/dL (ref 65–99)
GLUCOSE-CAPILLARY: 155 mg/dL — AB (ref 65–99)
Glucose-Capillary: 51 mg/dL — ABNORMAL LOW (ref 65–99)
Glucose-Capillary: 95 mg/dL (ref 65–99)

## 2016-06-07 LAB — URINALYSIS, ROUTINE W REFLEX MICROSCOPIC
BILIRUBIN URINE: NEGATIVE
Glucose, UA: NEGATIVE mg/dL
Hgb urine dipstick: NEGATIVE
KETONES UR: NEGATIVE mg/dL
NITRITE: POSITIVE — AB
PH: 7.5 (ref 5.0–8.0)
PROTEIN: NEGATIVE mg/dL
Specific Gravity, Urine: 1.013 (ref 1.005–1.030)

## 2016-06-07 LAB — BASIC METABOLIC PANEL
ANION GAP: 6 (ref 5–15)
BUN: 18 mg/dL (ref 6–20)
CALCIUM: 7.7 mg/dL — AB (ref 8.9–10.3)
CO2: 27 mmol/L (ref 22–32)
CREATININE: 0.73 mg/dL (ref 0.44–1.00)
Chloride: 96 mmol/L — ABNORMAL LOW (ref 101–111)
Glucose, Bld: 80 mg/dL (ref 65–99)
Potassium: 5 mmol/L (ref 3.5–5.1)
SODIUM: 129 mmol/L — AB (ref 135–145)

## 2016-06-07 LAB — URINE MICROSCOPIC-ADD ON: Squamous Epithelial / LPF: NONE SEEN

## 2016-06-07 MED ORDER — ENSURE ENLIVE PO LIQD
237.0000 mL | Freq: Four times a day (QID) | ORAL | Status: DC
  Administered 2016-06-07 – 2016-06-08 (×4): 237 mL via ORAL

## 2016-06-07 MED ORDER — TRAMADOL HCL 50 MG PO TABS
50.0000 mg | ORAL_TABLET | Freq: Four times a day (QID) | ORAL | Status: DC | PRN
  Administered 2016-06-08: 100 mg via ORAL
  Filled 2016-06-07: qty 2
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
## 2016-06-07 NOTE — Progress Notes (Signed)
Inpatient Diabetes Program Recommendations  AACE/ADA: New Consensus Statement on Inpatient Glycemic Control (2015)  Target Ranges:  Prepandial:   less than 140 mg/dL      Peak postprandial:   less than 180 mg/dL (1-2 hours)      Critically ill patients:  140 - 180 mg/dL   Lab Results  Component Value Date   GLUCAP 79 06/07/2016   HGBA1C 6.6 (H) 05/24/2016    Review of Glycemic Control:  Results for JOCELLYN, ZEPEDA (MRN [id number]) as of 06/07/2016 13:43  Ref. Range 06/07/2016 00:08 06/07/2016 04:35 06/07/2016 08:15 06/07/2016 10:04 06/07/2016 12:10  Glucose-Capillary Latest Ref Range: 65 - 99 mg/dL 95 51 (L) 59 (L) 68 79   Diabetes history: None Current orders for Inpatient glycemic control:  NPH 20 units bid, Novolog resistant q 4hours  Inpatient Diabetes Program Recommendations:    Called and spoke with PA.  Received order to d/c NPH insulin.  Note that patient is not currently receiving tube feeds.  Thanks, Adah Perl, RN, BC-ADM Inpatient Diabetes Coordinator Pager [phone] (8a-5p)

## 2016-06-07 NOTE — Progress Notes (Signed)
PT Cancellation Note  Patient Details Name: Marie [name] MRN: [id number] DOB: 16-Mar-1941   Cancelled Treatment:    Reason Eval/Treat Not Completed: Patient at procedure or test/unavailable; patient in radiology.  Will attempt this afternoon.   Reginia Naas 06/07/2016, 10:40 AM  Magda Kiel, PT [phone] 06/07/2016

## 2016-06-07 NOTE — Progress Notes (Signed)
Calorie Count Note  48 hour calorie count ordered.  Diet: Dysphagia 1, thin liquids Supplements: n/a  Case discussed with RN, who expressed concern over multiple hypoglycemic events due to poor oral intake. Pt's intake have been very minimal, per RN report, however calorie count data incomplete. Meal completion 0% per doc flowsheet records. SLP evaluated pt earlier and was advanced to a dysphagia 1 diet with thin liquids. Pt still with cortrak tube in place (KUB today reveals tip of tube over the distal stomach).   Spoke with pt at bedside, who was very difficult to understand. She complained to this RD that her mouth is dry and "I'm doing the best I can". She also complains about back pain from sitting up. She reports tolerating pureed textures and thin liquids well. Pt consumed a few bites of jello, as observed on tray table. Provided pt with encouragement.   Given pt's increased nutritional needs related to wound vac and poor oral intake related to mouth pain, suspect it will be very difficult for pt to meet nutritional needs by oral intake alone. RD will add supplements; interventions discussed with RN. If pt continues with poor oral intake, pt will greatly benefit from supplemental TF (bolus vs nocturnal feedings) to assist with maximizing nutritional intake.   Breakfast: 0% Lunch: no data available Dinner: 0% Supplements: n/a  Total intake: 0 kcal (0% of minimum estimated needs)  0 protein (0% of minimum estimated needs)  Nutrition Dx: Inadequate oral intake related to mouth pain as evidenced by PO <25%; ongoing  Goal: Patient will meet greater than or equal to 90% of their needs; unmet  Intervention:  -Continue calorie count; follow-up on 06-21-2016 -Ensure Enlive po QID, each supplement provides 350 kcal and 20 grams of protein -Pt may benefit from supplemental feedings to assist with meeting nutritional needs, recommend:   Initiate nocturnal feedings of Glucerna 1.2 @ 90 ml/hr  via cortrak tube over 12 hour period (ex 2000-0800)  Tube feeding regimen provides 1296 kcal (76% of needs), 65 grams of protein, and 869 ml of H2O.   Markeith Jue A. Jimmye Norman, RD, LDN, CDE Pager: [phone] After hours Pager: [phone]
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
## 2016-06-08 ENCOUNTER — Inpatient Hospital Stay (HOSPITAL_COMMUNITY): Payer: Medicare Other

## 2016-06-08 LAB — GLUCOSE, CAPILLARY
GLUCOSE-CAPILLARY: 98 mg/dL (ref 65–99)
Glucose-Capillary: 103 mg/dL — ABNORMAL HIGH (ref 65–99)
Glucose-Capillary: 108 mg/dL — ABNORMAL HIGH (ref 65–99)
Glucose-Capillary: 178 mg/dL — ABNORMAL HIGH (ref 65–99)
Glucose-Capillary: 85 mg/dL (ref 65–99)
Glucose-Capillary: 92 mg/dL (ref 65–99)

## 2016-06-08 MED ORDER — JEVITY 1.2 CAL PO LIQD
1000.0000 mL | ORAL | Status: DC
  Filled 2016-06-08 (×2): qty 1000

## 2016-06-08 MED ORDER — GLUCERNA 1.2 CAL PO LIQD
1000.0000 mL | ORAL | Status: DC
  Administered 2016-06-08 (×2): 1000 mL
  Filled 2016-06-08 (×2): qty 1000

## 2016-06-08 MED ORDER — METOPROLOL TARTRATE 25 MG/10 ML ORAL SUSPENSION
50.0000 mg | Freq: Two times a day (BID) | ORAL | Status: DC
  Administered 2016-06-08: 50 mg via ORAL
  Filled 2016-06-08 (×3): qty 20

## 2016-06-09 LAB — URINE CULTURE

## 2016-06-09 MED FILL — Medication: Qty: 1 | Status: AC
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
## 2016-06-14 NOTE — Significant Event (Signed)
Just received a call for cortrak feeding tube placement at 1530. However, this RN is unable to leave patient assignments at this time. This RN can do it in about an hour. Also, told primary RN to call cortrak backup team for placement if she is available to place one.
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
## 2016-06-14 NOTE — Progress Notes (Signed)
NGT confirmed via xray and read by 2 RNs before starting feeding pt

## 2016-06-14 NOTE — Progress Notes (Signed)
Pt very agitated and restless and is refusing all cares. Already pulled out her feeding tube this morning and is declining oral meds. MD and PA notified, order to reinsert feeding tube given.

## 2016-06-14 NOTE — Progress Notes (Signed)
Pt had a new feeding tube placed, portable xray ordered to confirm placement before starting tube feedings

## 2016-06-14 DEATH — deceased
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
## 2017-03-29 IMAGING — DX DG ABD PORTABLE 1V
1 series · 1 of 1 positions shown · non-contrast
Comparison: June 01, 2016

CLINICAL DATA: Ob syndrome.  Abdominal distention

EXAM:
PORTABLE ABDOMEN - 1 VIEW

[abdomen supine]
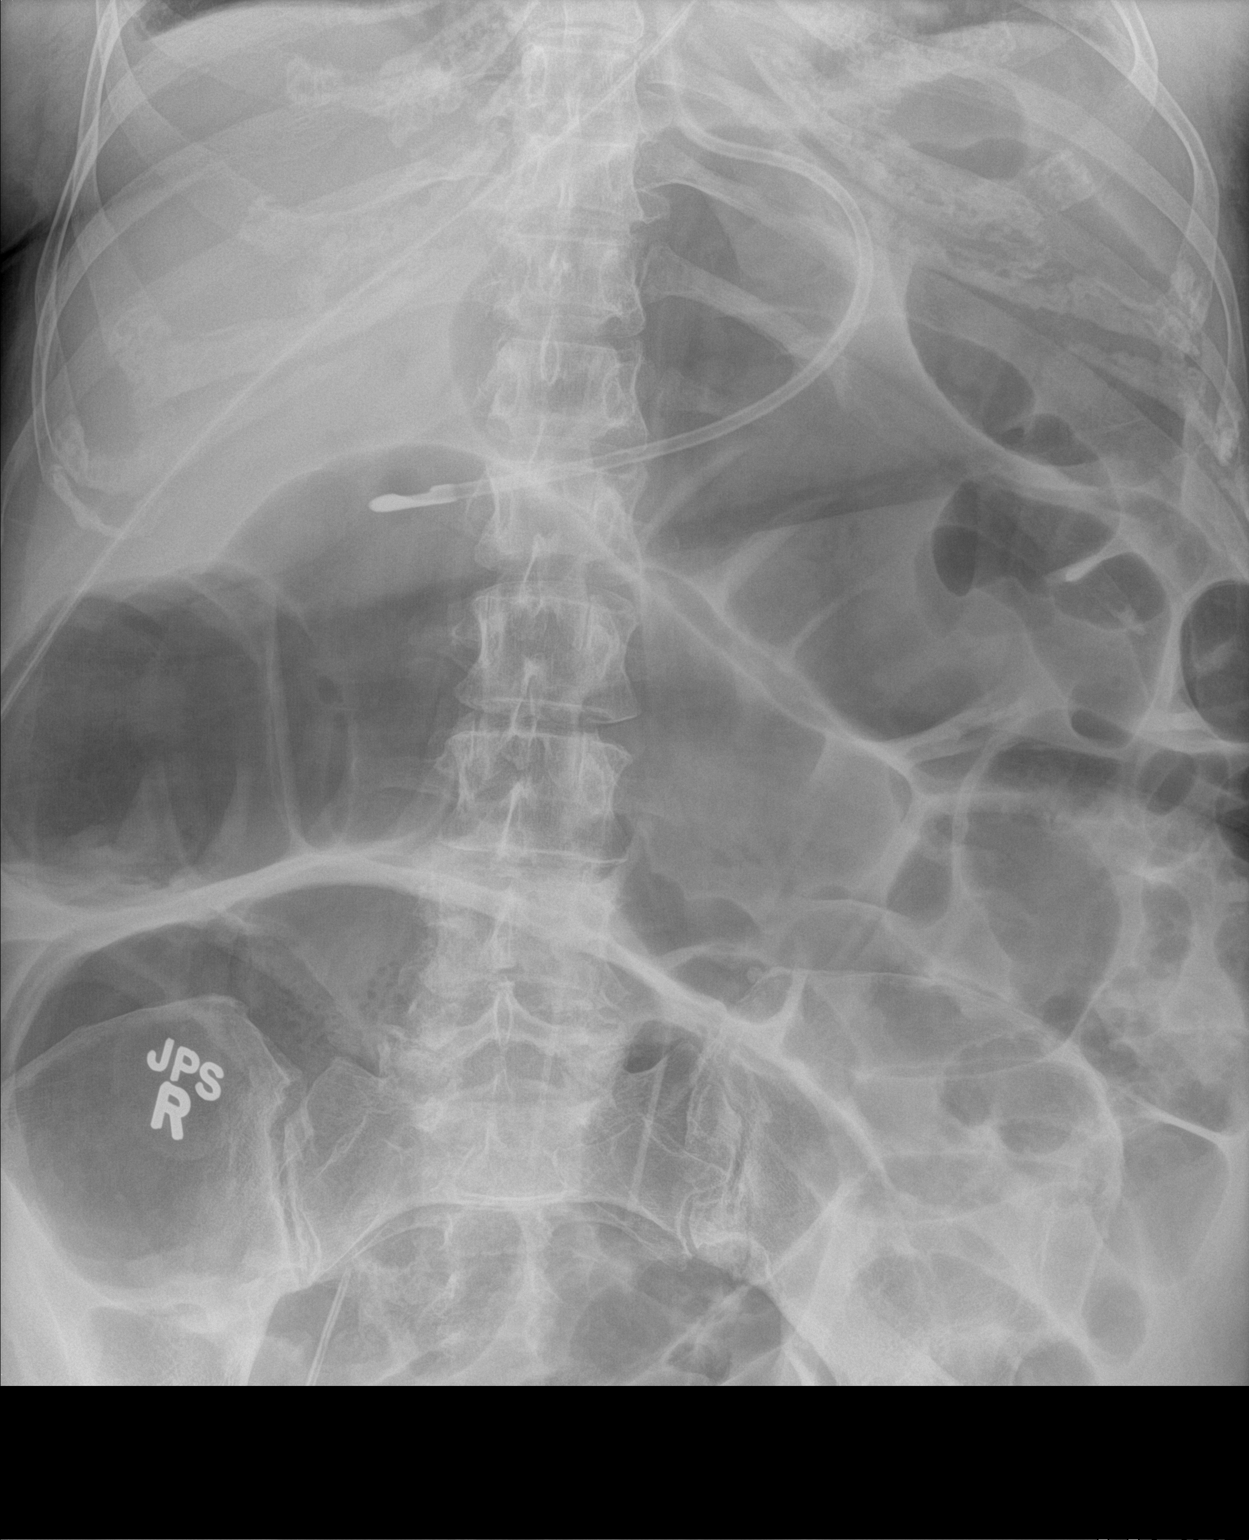

[1 of 1 positions shown; findings below may reference images not displayed]

FINDINGS: Enteric tube tip is in the distal stomach, unchanged. There remains
generalized colonic dilatation without focal area of obstruction.
Small bowel does not appear significantly distended. No free air
evident. There is a right femoral catheter with the tip overlying
the mid right sacrum, likely in the right external iliac artery.
There are phleboliths in the pelvis.
IMPRESSION: Persistent colonic dilatation without appreciable change from 1 day
prior. Enteric tube position unchanged. No free air. No progression
of bowel dilatation. There appears to be marginally less dilatation
overall compared to 1 day prior.

## 2017-04-03 IMAGING — CR DG PELVIS 3+V JUDET
3 series · 3 of 3 positions shown · non-contrast
Comparison: 06/02/2016

CLINICAL DATA: Pelvic fracture

EXAM:
JUDET PELVIS - 3+ VIEW

[pelvis ap]
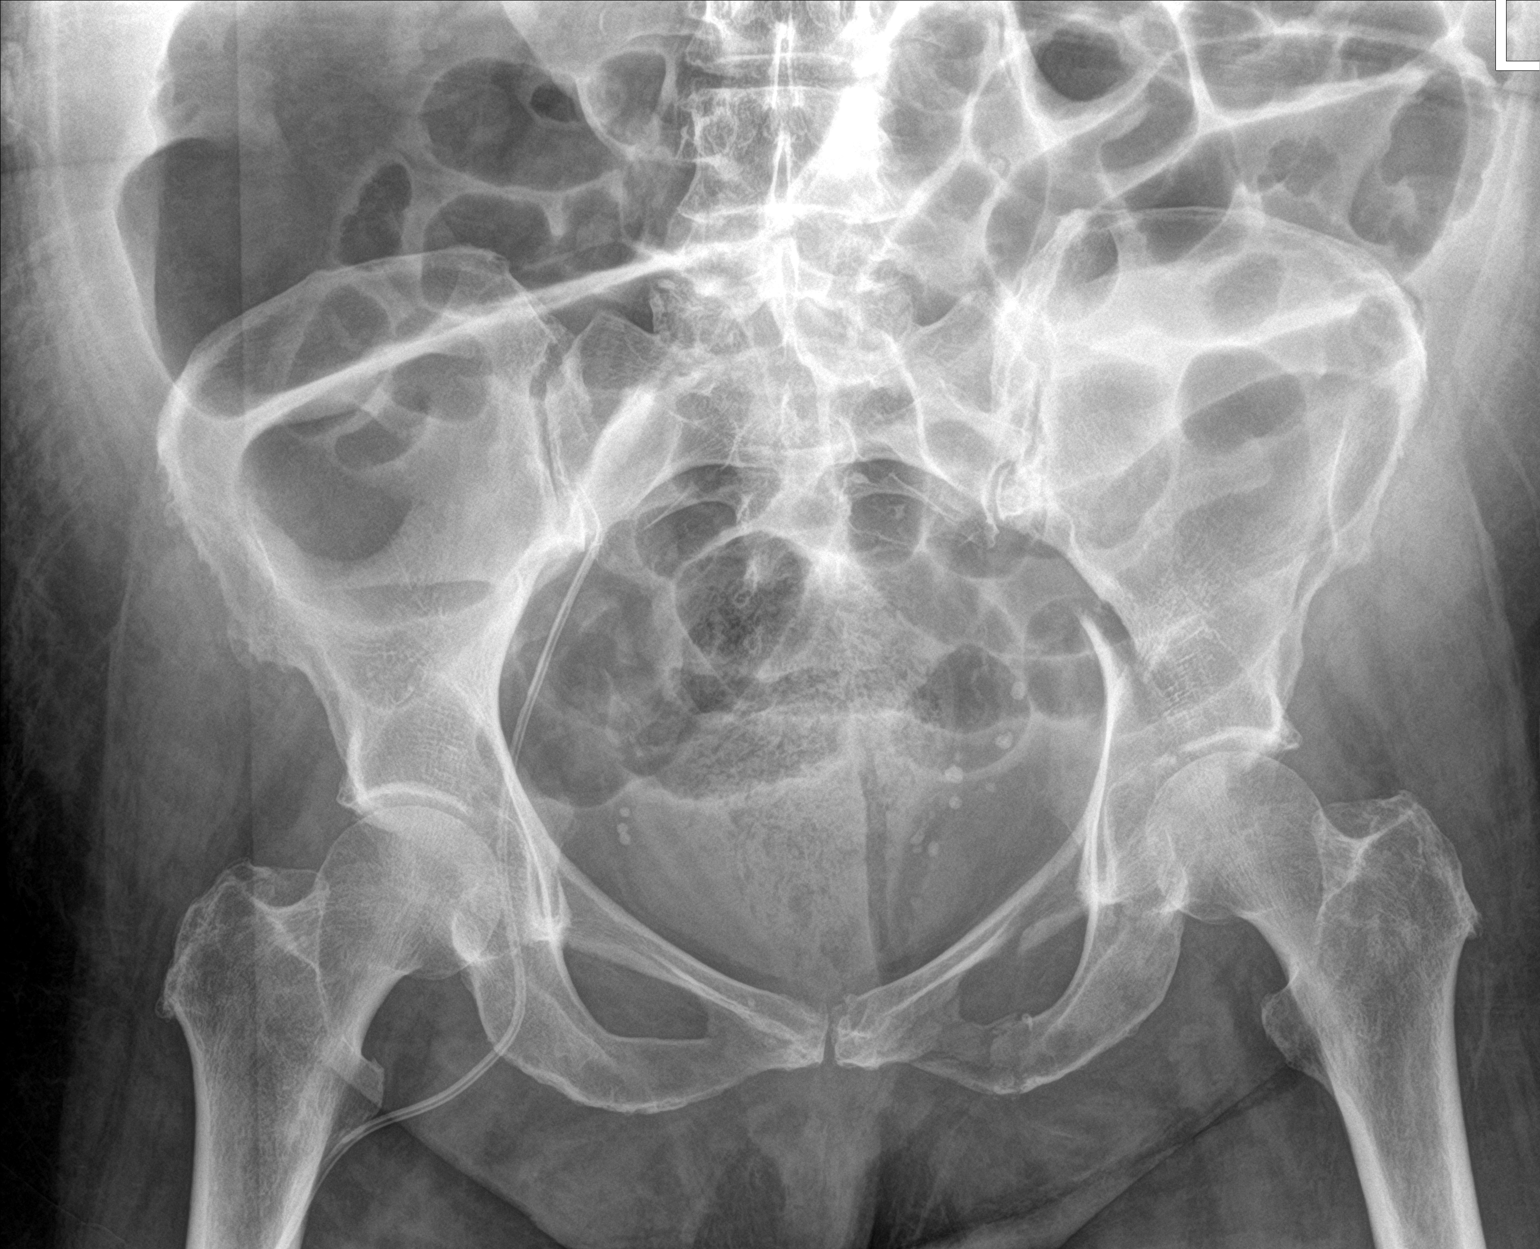

[pelvis obl (1 of 2)]
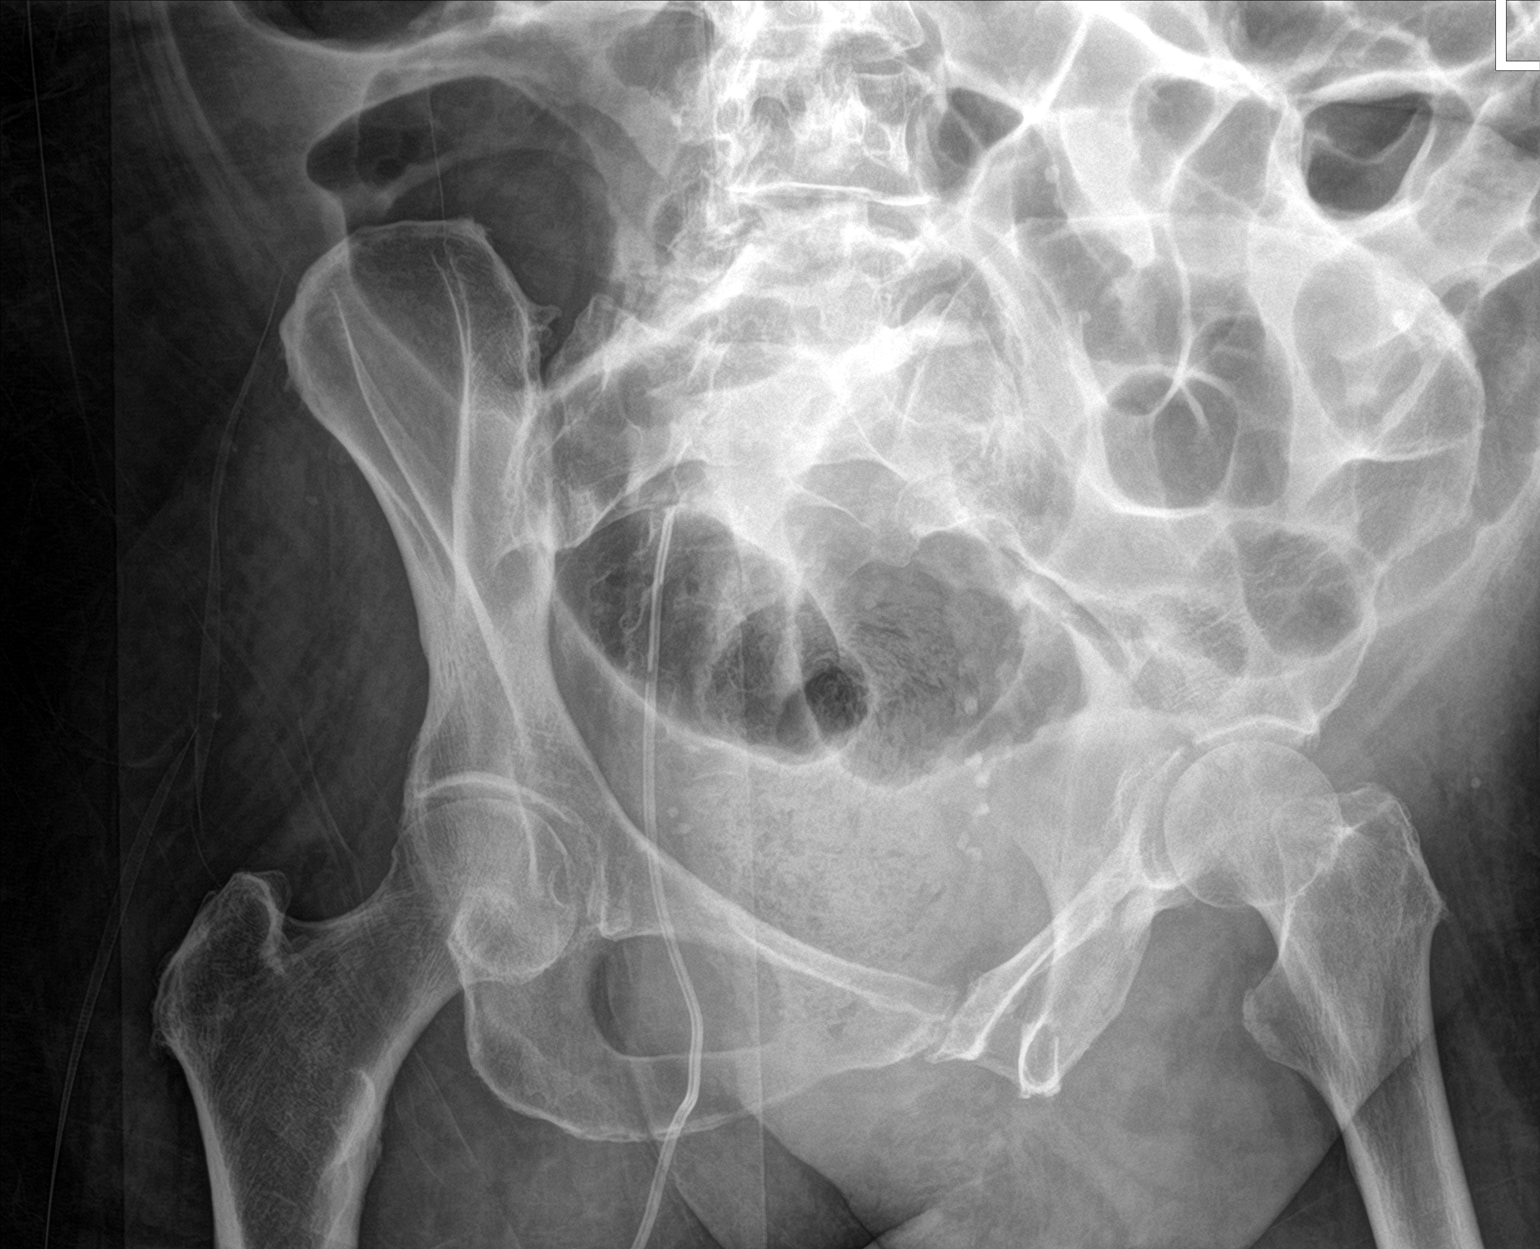

[pelvis obl (2 of 2)]
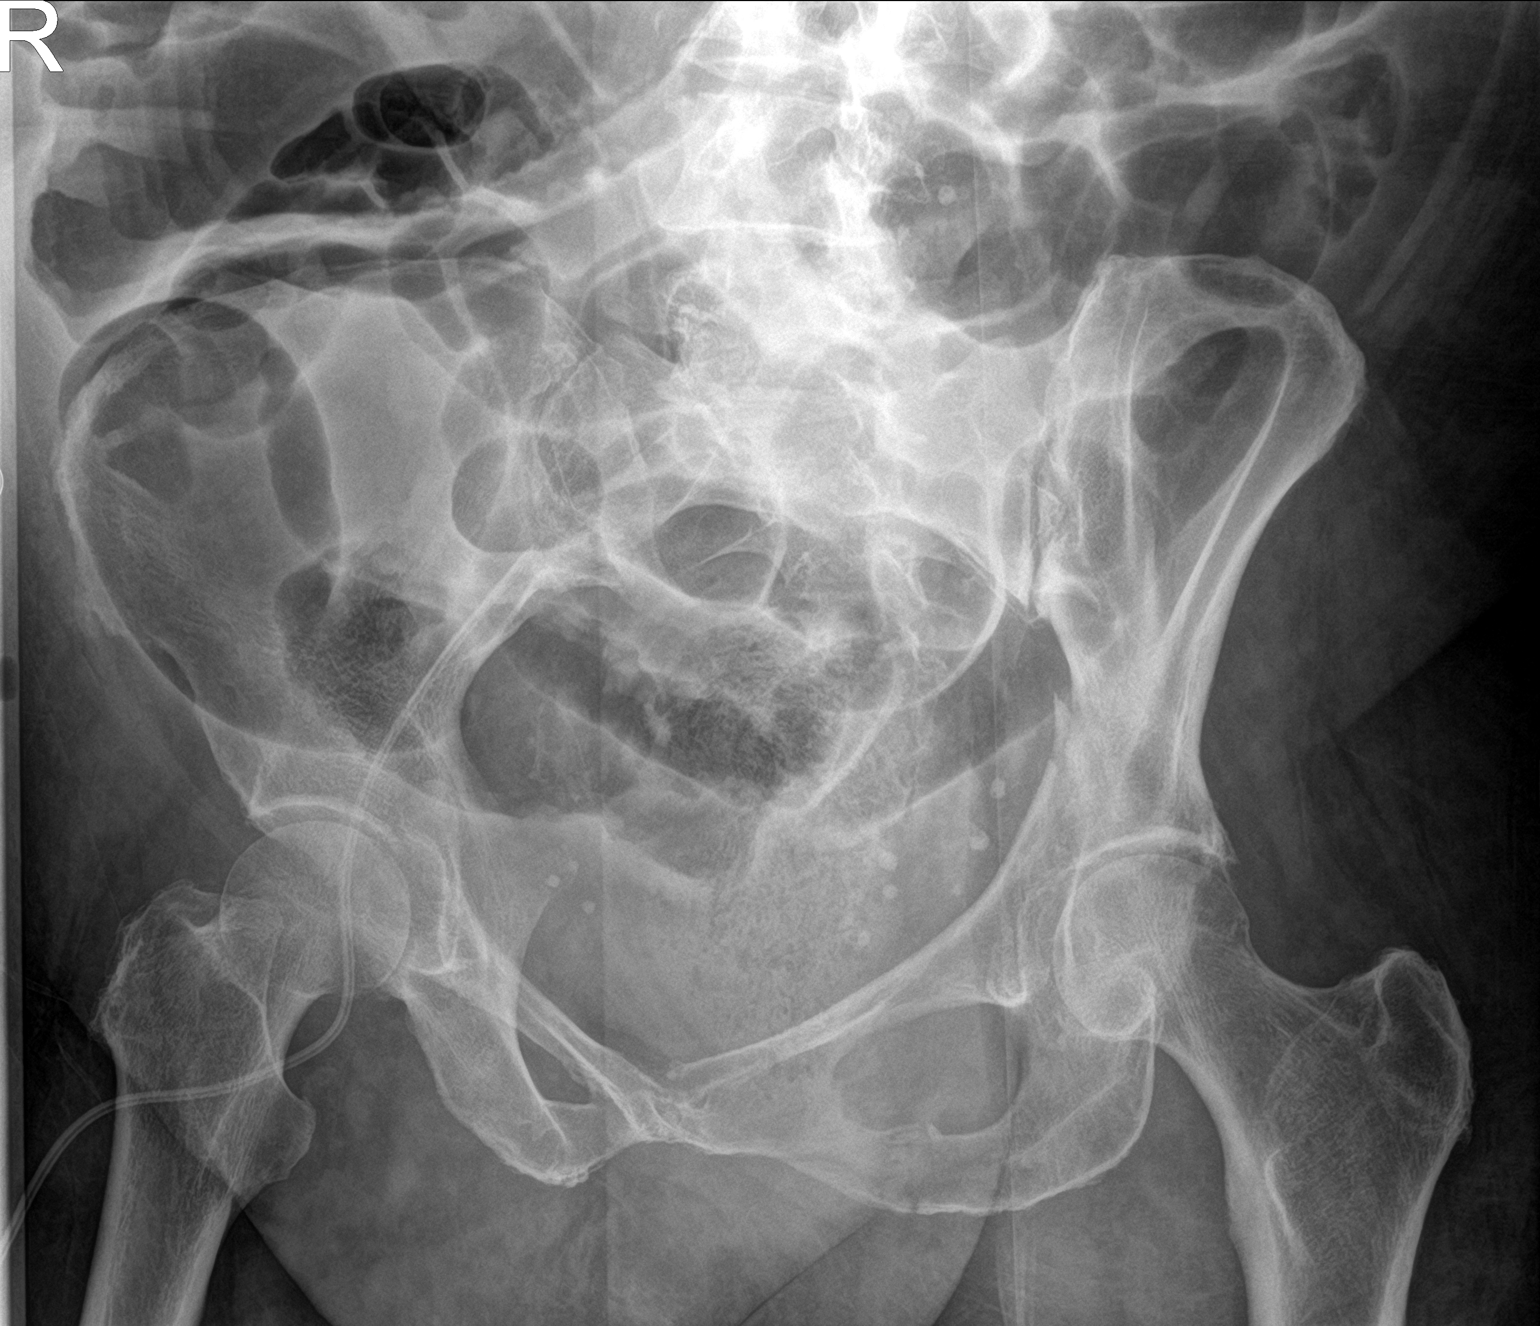

[3 of 3 positions shown; findings below may reference images not displayed]

FINDINGS: Displaced fractures involving the left acetabulum, left superior
pubic ramus, and left inferior pubic ramus are stable in appearance.
Alignment is stable. No new fracture. Distended loops of bowel
project over the abdomen compatible with ileus. Right femoral
vascular catheter is stable in position.
IMPRESSION: Stable left pelvic fractures as described.  Ileus pattern persists.
